# Patient Record
Sex: Male | Born: 1945 | Race: White | Hispanic: Yes | Marital: Married | Smoking: Current some day smoker
Health system: Southern US, Community
[De-identification: ages and names within clinical notes are randomized; demographics above are authoritative.]

## PROBLEM LIST (undated history)

## (undated) ENCOUNTER — Emergency Department (HOSPITAL_COMMUNITY): Admission: EM | Payer: Self-pay | Source: Home / Self Care

## (undated) DIAGNOSIS — Z950 Presence of cardiac pacemaker: Secondary | ICD-10-CM

## (undated) DIAGNOSIS — I2510 Atherosclerotic heart disease of native coronary artery without angina pectoris: Secondary | ICD-10-CM

## (undated) DIAGNOSIS — I10 Essential (primary) hypertension: Secondary | ICD-10-CM

## (undated) DIAGNOSIS — I495 Sick sinus syndrome: Secondary | ICD-10-CM

## (undated) HISTORY — DX: Presence of cardiac pacemaker: Z95.0

## (undated) HISTORY — PX: PACEMAKER INSERTION: SHX728

## (undated) HISTORY — DX: Sick sinus syndrome: I49.5

---

## 2007-06-09 ENCOUNTER — Ambulatory Visit: Payer: Self-pay | Admitting: Internal Medicine

## 2007-09-13 ENCOUNTER — Ambulatory Visit: Payer: Self-pay | Admitting: Internal Medicine

## 2008-02-24 ENCOUNTER — Ambulatory Visit: Payer: Self-pay

## 2008-12-21 ENCOUNTER — Encounter: Payer: Self-pay | Admitting: Internal Medicine

## 2008-12-26 ENCOUNTER — Ambulatory Visit: Payer: Self-pay | Admitting: Internal Medicine

## 2009-02-07 ENCOUNTER — Ambulatory Visit: Payer: Self-pay | Admitting: Internal Medicine

## 2009-05-09 ENCOUNTER — Ambulatory Visit: Payer: Self-pay | Admitting: Internal Medicine

## 2009-08-08 ENCOUNTER — Ambulatory Visit: Payer: Self-pay | Admitting: Internal Medicine

## 2009-08-10 DIAGNOSIS — I495 Sick sinus syndrome: Secondary | ICD-10-CM

## 2009-08-10 HISTORY — DX: Sick sinus syndrome: I49.5

## 2009-08-14 ENCOUNTER — Ambulatory Visit: Payer: Self-pay | Admitting: Internal Medicine

## 2009-08-14 DIAGNOSIS — R072 Precordial pain: Secondary | ICD-10-CM | POA: Insufficient documentation

## 2009-11-07 ENCOUNTER — Ambulatory Visit: Payer: Self-pay | Admitting: Internal Medicine

## 2010-01-03 ENCOUNTER — Telehealth: Payer: Self-pay | Admitting: Internal Medicine

## 2010-02-06 ENCOUNTER — Ambulatory Visit: Payer: Self-pay | Admitting: Internal Medicine

## 2010-02-12 ENCOUNTER — Ambulatory Visit: Payer: Self-pay | Admitting: Internal Medicine

## 2010-02-12 DIAGNOSIS — F341 Dysthymic disorder: Secondary | ICD-10-CM | POA: Insufficient documentation

## 2010-05-08 ENCOUNTER — Ambulatory Visit: Payer: Self-pay | Admitting: Internal Medicine

## 2010-06-04 ENCOUNTER — Ambulatory Visit: Payer: Self-pay | Admitting: Internal Medicine

## 2010-06-04 DIAGNOSIS — R42 Dizziness and giddiness: Secondary | ICD-10-CM | POA: Insufficient documentation

## 2010-06-04 DIAGNOSIS — R0989 Other specified symptoms and signs involving the circulatory and respiratory systems: Secondary | ICD-10-CM

## 2010-06-04 DIAGNOSIS — R0609 Other forms of dyspnea: Secondary | ICD-10-CM | POA: Insufficient documentation

## 2010-06-06 ENCOUNTER — Telehealth (INDEPENDENT_AMBULATORY_CARE_PROVIDER_SITE_OTHER): Payer: Self-pay | Admitting: *Deleted

## 2010-06-06 ENCOUNTER — Encounter: Payer: Self-pay | Admitting: Internal Medicine

## 2010-06-10 ENCOUNTER — Encounter (HOSPITAL_COMMUNITY): Admission: RE | Admit: 2010-06-10 | Discharge: 2010-07-11 | Payer: Self-pay | Admitting: Internal Medicine

## 2010-06-10 ENCOUNTER — Ambulatory Visit: Payer: Self-pay | Admitting: Internal Medicine

## 2010-06-10 ENCOUNTER — Ambulatory Visit: Payer: Self-pay

## 2010-07-01 ENCOUNTER — Ambulatory Visit: Payer: Self-pay

## 2010-07-01 ENCOUNTER — Encounter: Payer: Self-pay | Admitting: Internal Medicine

## 2010-07-29 ENCOUNTER — Encounter: Payer: Self-pay | Admitting: Internal Medicine

## 2010-07-29 ENCOUNTER — Ambulatory Visit: Payer: Self-pay

## 2010-08-07 ENCOUNTER — Ambulatory Visit: Payer: Self-pay | Admitting: Internal Medicine

## 2010-08-07 DIAGNOSIS — IMO0001 Reserved for inherently not codable concepts without codable children: Secondary | ICD-10-CM | POA: Insufficient documentation

## 2010-08-13 ENCOUNTER — Ambulatory Visit: Payer: Self-pay | Admitting: Internal Medicine

## 2010-08-13 LAB — CONVERTED CEMR LAB
Basophils Absolute: 0 10*3/uL (ref 0.0–0.1)
CO2: 25 meq/L (ref 19–32)
Calcium: 8.9 mg/dL (ref 8.4–10.5)
Creatinine, Ser: 0.8 mg/dL (ref 0.4–1.5)
Eosinophils Absolute: 0.3 10*3/uL (ref 0.0–0.7)
INR: 1 (ref 0.8–1.0)
Lymphocytes Relative: 29.6 % (ref 12.0–46.0)
MCHC: 34.1 g/dL (ref 30.0–36.0)
Neutrophils Relative %: 61.9 % (ref 43.0–77.0)
Prothrombin Time: 10.3 s (ref 9.7–11.8)
RBC: 4.24 M/uL (ref 4.22–5.81)
RDW: 14.6 % (ref 11.5–14.6)
TSH: 0.67 microintl units/mL (ref 0.35–5.50)

## 2010-08-19 ENCOUNTER — Ambulatory Visit: Payer: Self-pay | Admitting: Internal Medicine

## 2010-08-19 ENCOUNTER — Ambulatory Visit (HOSPITAL_COMMUNITY)
Admission: RE | Admit: 2010-08-19 | Discharge: 2010-08-19 | Payer: Self-pay | Source: Home / Self Care | Admitting: Internal Medicine

## 2010-08-21 ENCOUNTER — Telehealth: Payer: Self-pay | Admitting: Internal Medicine

## 2010-08-22 ENCOUNTER — Encounter: Payer: Self-pay | Admitting: Internal Medicine

## 2010-09-02 ENCOUNTER — Ambulatory Visit: Payer: Self-pay

## 2010-11-20 NOTE — Letter (Signed)
Summary: Implantable Device Instructions  Architectural technologist, Main Office  [address]   [place], [place] [address]   Phone: [phone]  Fax: [phone]      Implantable Device Instructions  You are scheduled for:  ___x__ Generator Change  on August 19, 2010 @ 9:00 am with Dr. Graciela Husbands.  1.  Please arrive at the Short Stay Center at [facility] at 7:00 am on the day of your procedure.  2.  Do not eat or drink after midnight  the night before your procedure.  3.  Complete lab work on August 13, 2010 at 3:00 pm.  The lab at [address] is open from 8:30 AM to 1:30 PM and from 2:30 PM to 5:00 PM.  The lab at [facility] is open from 7:30 AM to 5:30 PM.  You do not have to be fasting.  4.  Plan for an overnight stay.  Bring your insurance cards and a list of your medications.  6.  Wash your chest and neck with antibacterial soap (any brand) the evening before and the morning of your procedure.  Rinse well.   *If you have ANY questions after you get home, please call the office [phone]. Claris Gladden, RN  *Every attempt is made to prevent procedures from being rescheduled.  Due to the nauture of Electrophysiology, rescheduling can happen.  The physician is always aware and directs the staff when this occurs.

## 2010-11-20 NOTE — Cardiovascular Report (Signed)
Summary: TTM   TTM   Imported By: Roderic Ovens 02/15/2010 15:04:56  _____________________________________________________________________  External Attachment:    Type:   Image     Comment:   External Document

## 2010-11-20 NOTE — Cardiovascular Report (Signed)
Summary: Office Visit   Office Visit   Imported By: Roderic Ovens 07/16/2010 15:04:04  _____________________________________________________________________  External Attachment:    Type:   Image     Comment:   External Document

## 2010-11-20 NOTE — Cardiovascular Report (Signed)
Summary: Pre Op Orders   Pre Op Orders   Imported By: Roderic Ovens 08/16/2010 13:09:20  _____________________________________________________________________  External Attachment:    Type:   Image     Comment:   External Document

## 2010-11-20 NOTE — Procedures (Signed)
Summary: DEVICE/SAF  Medications Added TRAMADOL HCL 50 MG TABS (TRAMADOL HCL) Take 1 tablet two times a day as needed      Allergies Added: NKDA  Current Medications (verified): 1)  Quinapril Hcl 5 Mg Tabs (Quinapril Hcl) .... Take One Tablet Once Daily 2)  Atenolol 50 Mg Tabs (Atenolol) .... 1/2 By Mouth Daily 3)  Aspirin 325 Mg Tabs (Aspirin) .... Once Daily 4)  Valium 5 Mg Tabs (Diazepam) .... As Needed 5)  Tramadol Hcl 50 Mg Tabs (Tramadol Hcl) .... Take 1 Tablet Two Times A Day As Needed  Allergies (verified): No Known Drug Allergies  PPM Specifications Following MD:  Sherryl Manges, MD     PPM Vendor:  St Jude     PPM Model Number:  [id number]     PPM Serial Number:  [id number] PPM DOI:  03/25/1999     PPM Implanting MD:  Sherryl Manges, MD  Lead 1    Location: RA     DOI: 03/25/1999     Model #: 1388TC     Serial #: [id number]     Status: active Lead 2    Location: RV     DOI: 03/25/1999     Model #: [id number]     Serial #: [id number]     Status: active  Magnet Response Rate:  BOL 98.6 ERI  86.3  Indications:  Sick sinus syndrome   PPM Follow Up Battery Voltage:  2.65 V     Pacer Dependent:  Yes      Episodes Coumadin:  No  Parameters Mode:  DDDR     Lower Rate Limit:  60     Upper Rate Limit:  105 Paced AV Delay:  250     Sensed AV Delay:  200 Tech Comments:  INTERROGATION ONLY--BATTERY VOLTAGE 2.65--PER JENNY @ [facility]--ANYTIME W/100% PACING.  ROV 07-29-10 FOR BATTERY CHECK. Vella Kohler  July 01, 2010 1:54 PM

## 2010-11-20 NOTE — Assessment & Plan Note (Signed)
Summary: Nuc-Pre-Procedure       Nuclear Med Background Indications for Stress Test: Evaluation for Ischemia, Surgical Clearance  Indications Comments: Pending generator change in September by Dr. Berton Mount  History: GXT, Pacemaker  History Comments: '05 EAV:WUJWJX; '02 PTVP  Symptoms: Chest Pressure, Chest Pressure with Exertion, Diaphoresis, Dizziness, DOE, Palpitations, Rapid HR  Symptoms Comments: Last episode of BJ:[id number].   Nuclear Pre-Procedure Cardiac Risk Factors: Hypertension, Lipids Caffeine/Decaff Intake: none NPO After: 7:30 PM Lungs: Clear.  O2 Sat 97% on RA. IV 0.9% NS with Angio Cath: 22g     IV Site: L hand IV Started by: Darrick Penna Chest Size (in) 40     Height (in): 69 Weight (lb): 182 BMI: 26.97  Nuclear Med Study 1 or 2 day study:  1 day     Stress Test Type:  Eugenie Birks Reading MD:  Arvilla Meres, MD     Referring MD:  Berton Mount, MD Resting Radionuclide:  Technetium 73m Tetrofosmin     Resting Radionuclide Dose:  10.4 mCi  Stress Radionuclide:  Technetium 32m Tetrofosmin     Stress Radionuclide Dose:  33 mCi   Stress Protocol   Lexiscan: 0.4 mg   Stress Test Technologist:  Rea College CMA-N     Nuclear Technologist:  Domenic Polite CNMT  Rest Procedure  Myocardial perfusion imaging was performed at rest 45 minutes following the intravenous administration of Myoview Technetium 56m Tetrofosmin.  Stress Procedure  The patient received IV Lexiscan 0.4 mg over 15-seconds.  Myoview injected at 30-seconds.  There were no significant changes with infusion.  Quantitative spect images were obtained after a 45 minute delay.  QPS Raw Data Images:  Normal; no motion artifact; normal heart/lung ratio. Stress Images:  There is normal uptake in all areas. Rest Images:  Normal homogeneous uptake in all areas of the myocardium. Subtraction (SDS):  Normal Transient Ischemic Dilatation:  1.08  (Normal <1.22)  Lung/Heart Ratio:  .30   (Normal <0.45)  Quantitative Gated Spect Images QGS EDV:  118 ml QGS ESV:  59 ml QGS EF:  50 % QGS cine images:  Low normal EF. No focal wall motion abnormalities.  Findings Normal nuclear study      Overall Impression  Exercise Capacity: Lexiscan study with no exercise. ECG Impression: Baseline: NSR; No significant ST segment change with Lexiscan. Overall Impression: Normal stress nuclear study. Overall Impression Comments: Perfusion is normal. Low normal EF. No focal wall motion abnormalities.    Appended Document: Nuc-Pre-Procedure PT AWARE./cy

## 2010-11-20 NOTE — Progress Notes (Signed)
Summary: Nuc Pre-Procedure  Phone Note Outgoing Call Call back at [facility] Phone [phone]   Call placed by: Antionette Char RN,  June 06, 2010 1:45 PM Call placed to: Patient Reason for Call: Confirm/change Appt Summary of Call: Reviewed information on Myoview Information Sheet (see scanned document for further details).  Spoke with patient.     Nuclear Med Background Indications for Stress Test: Evaluation for Ischemia   History: GXT, Pacemaker  History Comments: 2005 Negative GXT 2002 Pacemaker for SSS Chronotropic Incompetence  Symptoms: Dizziness, DOE    Nuclear Pre-Procedure Cardiac Risk Factors: Hypertension, Lipids Height (in): 69

## 2010-11-20 NOTE — Assessment & Plan Note (Signed)
Summary: pc2/st jude/lg      Allergies Added: NKDA  History of Present Illness:    Lawrence Shaw is status post pacemaker implantation for sinus node  dysfunction.  He continues to complain of chronic left-sided chest pain. This is moderately worsened with exertion. However, he states back to when he underwent one of his extraction procedures with a left thoracotomy.  I have recommended Myoview scanning in the past which is refused. I also recommended pain clinic referral which he has declined.  Problems Prior to Update: 1)  Chest Pain, Precordial  (ICD-786.51) 2)  Sinoatrial Node Dysfunction  (ICD-427.81) 3)  Cardiac Pacemaker-st Jude Affinity Dr 5330  (ICD-V45.01)  Current Medications (verified): 1)  Quinapril Hcl 5 Mg Tabs (Quinapril Hcl) .... Take One Tablet Once Daily 2)  Atenolol 50 Mg Tabs (Atenolol) .... Take One Tablet Once Daily 3)  Aspirin 325 Mg Tabs (Aspirin) .... Once Daily 4)  Valium 5 Mg Tabs (Diazepam) .... As Needed  Allergies (verified): No Known Drug Allergies  Past History:  Past Medical History: Last updated: 08/10/2009 Sinus node dysfunction GE reflux disease Hypertension Dyslipidemia Eczema Anxiety Tinnitus Pacemaker-St. Jude Affinity DR 5330     Past Surgical History: Last updated: 08/10/2009 Pacemaker implantation  Pacemaker extraction and reimplantation-St Jude affinity DR 5330  Vital Signs:  Patient profile:   65 year old male Height:      69 inches (175.26 cm) Weight:      184 pounds (83.64 kg) BMI:     27.27 Pulse rate:   68 / minute BP sitting:   118 / 78  (left arm) Cuff size:   regular  Vitals Entered By: Judithe Modest CMA (February 12, 2010 11:43 AM)  Physical Exam  General:  The patient was alert and oriented in no acute distress. HEENT Normal.  Neck veins were flat, carotids were brisk.  Lungs were clear.  Heart sounds were regular without murmurs or gallops.  Abdomen was soft with active bowel sounds. There is no clubbing  cyanosis or edema. Skin Warm and dry    PPM Specifications Following MD:  Sherryl Manges, MD     PPM Vendor:  St Jude     PPM Model Number:  [id number]     PPM Serial Number:  [id number] PPM DOI:  03/25/1999     PPM Implanting MD:  Sherryl Manges, MD  Lead 1    Location: RA     DOI: 03/25/1999     Model #: 1388TC     Serial #: [id number]     Status: active Lead 2    Location: RV     DOI: 03/25/1999     Model #: 1388TC     Serial #: [id number]     Status: active  Magnet Response Rate:  BOL 98.6 ERI  86.3  Indications:  Sick sinus syndrome   PPM Follow Up Remote Check?  No Battery Voltage:  2.72 V     Pacer Dependent:  Yes       PPM Device Measurements Atrium  Amplitude: 4.0 mV, Impedance: 370 ohms, Threshold: 0.75 V at 0.4 msec Right Ventricle  Amplitude: 8.0 mV, Impedance: 410 ohms, Threshold: 1.5 V at 0.4 msec  Episodes MS Episodes:  0     Percent Mode Switch:  0     Coumadin:  No Atrial Pacing:  95%     Ventricular Pacing:  51%  Parameters Mode:  DDDR     Lower Rate Limit:  60  Upper Rate Limit:  105 Paced AV Delay:  250     Sensed AV Delay:  200 Next Cardiology Appt Due:  08/20/2010 Tech Comments:  RV reprogrammed 3.0@0 .4.  TTM's with [employer].  ROV 6 months clinic.  Checked by [employer].   Altha Harm, LPN  February 12, 2010 12:05 PM   Impression & Recommendations:  Problem # 1:  CARDIAC PACEMAKER-ST JUDE AFFINITY DR 5330 (ICD-V45.01) Device parameters and data were reviewed and no changes were made  Estimated longevity is 9 months at 100% pacing. We will plan to see him again in 4 months because there is a great deal of anxiety that the patient has related to battery longevity.  Problem # 2:  CHEST PAIN, PRECORDIAL (ICD-786.51) I again offered for his consideration chest pain referral. He is again declining. We also discussed the potential interaction of pain and depression as his affect is exceedingly flat and sad. Hehowever  would like not to pursue assistance in this regard His  updated medication list for this problem includes:    Quinapril Hcl 5 Mg Tabs (Quinapril hcl) .Marland Kitchen... Take one tablet once daily    Atenolol 50 Mg Tabs (Atenolol) .Marland Kitchen... Take one tablet once daily    Aspirin 325 Mg Tabs (Aspirin) ..... Once daily  Problem # 3:  SINOATRIAL NODE DYSFUNCTION (ICD-427.81) stable device function His updated medication list for this problem includes:    Quinapril Hcl 5 Mg Tabs (Quinapril hcl) .Marland Kitchen... Take one tablet once daily    Atenolol 50 Mg Tabs (Atenolol) .Marland Kitchen... Take one tablet once daily    Aspirin 325 Mg Tabs (Aspirin) ..... Once daily  Appended Document: [facility]      Allergies: No Known Drug Allergies   PPM Specifications Following MD:  Sherryl Manges, MD     PPM Vendor:  St Jude     PPM Model Number:  5330     PPM Serial Number:  [id number] PPM DOI:  03/25/1999     PPM Implanting MD:  Sherryl Manges, MD  Lead 1    Location: RA     DOI: 03/25/1999     Model #: 1388TC     Serial #: [id number]     Status: active Lead 2    Location: RV     DOI: 03/25/1999     Model #: 1388TC     Serial #: [id number]     Status: active  Magnet Response Rate:  BOL 98.6 ERI  86.3  Indications:  Sick sinus syndrome   PPM Follow Up Pacer Dependent:  Yes      Episodes Coumadin:  No  Parameters Mode:  DDDR     Lower Rate Limit:  60     Upper Rate Limit:  105 Paced AV Delay:  250     Sensed AV Delay:  200  Impression & Recommendations:  Problem # 1:  ANXIETY DEPRESSION (ICD-300.4) The patient anxiety is significant and it appears as if he has a secondary depression. We talked about a little bit. As noted previously he would like at this point not to pursue assistance in this regard

## 2010-11-20 NOTE — Cardiovascular Report (Signed)
Summary: TTM   TTM   Imported By: Roderic Ovens 05/17/2010 08:49:17  _____________________________________________________________________  External Attachment:    Type:   Image     Comment:   External Document

## 2010-11-20 NOTE — Miscellaneous (Signed)
Summary: Device change out  Clinical Lists Changes  Observations: Added new observation of PPM DOI: 08/19/2010 (08/22/2010 7:32) Added new observation of PPM SERL#: [id number]  (08/22/2010 7:32) Added new observation of PPM MODL#: [id number]  (08/22/2010 1:19) Added new observation of PPMEXPLCOMM: 08/19/10 St. Jude Affinity 5330/620000  explanted  (08/22/2010 7:32)      PPM Specifications Following MD:  Sherryl Manges, MD     PPM Vendor:  St Jude     PPM Model Number:  [id number]     PPM Serial Number:  [id number] PPM DOI:  08/19/2010     PPM Implanting MD:  Sherryl Manges, MD  Lead 1    Location: RA     DOI: 03/25/1999     Model #: 1388TC     Serial #: [id number]     Status: active Lead 2    Location: RV     DOI: 03/25/1999     Model #: 1388TC     Serial #: [id number]     Status: active  Magnet Response Rate:  BOL 98.6 ERI  86.3  Indications:  Sick sinus syndrome  Explantation Comments:  08/19/10 St. Jude Affinity 5330/620000  explanted  PPM Follow Up Pacer Dependent:  Yes      Episodes Coumadin:  No  Parameters Mode:  DDDR     Lower Rate Limit:  60     Upper Rate Limit:  105 Paced AV Delay:  250     Sensed AV Delay:  200

## 2010-11-20 NOTE — Cardiovascular Report (Signed)
Summary: Office Visit   Office Visit   Imported By: Roderic Ovens 06/05/2010 11:37:08  _____________________________________________________________________  External Attachment:    Type:   Image     Comment:   External Document

## 2010-11-20 NOTE — Progress Notes (Signed)
Summary: why he wasn't sent home with antibotics   Phone Note Call from Patient Call back at Home Phone [phone]   Caller: Daughter- Jovita Gamma [phone] Reason for Call: Talk to Nurse Summary of Call: per pt dtr, pt was wondering why he wasn't sent home with antiboitic.  Initial call taken by: Lorne Skeens,  August 21, 2010 11:45 AM  Follow-up for Phone Call        spoke w/pt daughter and discussed ABX and she expressed understanding.  Jovita Gamma stated that she has given her Father Tylenol and Ibuprofen and this does not relieve his pain. Pain is 8-9/10. Will see if can get something else prescribed.  Follow-up by: Claris Gladden RN,  August 21, 2010 2:01 PM  Additional Follow-up for Phone Call Additional follow up Details #1::        Spoke w/DOD and he adv pt needs to be seen in ED or urgent care to be checked for pneumothorax. Adv pt daughter and she is hesitant to take her Father because of the long wait. Stressed importance of having this checked because of unrelieved pain. She will discuss w/her Father and call back.  Pt denies SHOB.  Adv Pt daughter that I would let the ED know they were on their way when ready.  Additional Follow-up by: Claris Gladden RN,  August 21, 2010 2:24 PM    Additional Follow-up for Phone Call Additional follow up Details #2::    spoke w/Mr. Schoch and he states that his pain is a lot better today.  Follow-up by: Claris Gladden RN,  August 22, 2010 9:01 AM

## 2010-11-20 NOTE — Procedures (Signed)
Summary: DEVICE/SAF      Allergies Added: NKDA  Current Medications (verified): 1)  Quinapril Hcl 5 Mg Tabs (Quinapril Hcl) .... Take One Tablet Once Daily 2)  Atenolol 50 Mg Tabs (Atenolol) .... 1/2 By Mouth Daily 3)  Aspirin 325 Mg Tabs (Aspirin) .... Once Daily 4)  Valium 5 Mg Tabs (Diazepam) .... As Needed 5)  Tramadol Hcl 50 Mg Tabs (Tramadol Hcl) .... Take 1 Tablet Two Times A Day As Needed  Allergies (verified): No Known Drug Allergies  PPM Specifications Following MD:  Sherryl Manges, MD     PPM Vendor:  St Jude     PPM Model Number:  [phone]     PPM Serial Number:  [id number] PPM DOI:  03/25/1999     PPM Implanting MD:  Sherryl Manges, MD  Lead 1    Location: RA     DOI: 03/25/1999     Model #: 1388TC     Serial #: [id number]     Status: active Lead 2    Location: RV     DOI: 03/25/1999     Model #: 1388TC     Serial #: [id number]     Status: active  Magnet Response Rate:  BOL 98.6 ERI  86.3  Indications:  Sick sinus syndrome   PPM Follow Up Remote Check?  No Battery Voltage:  2.62 V     Battery Est. Longevity:  ERI     Pacer Dependent:  Yes      Episodes Coumadin:  No  Parameters Mode:  DDDR     Lower Rate Limit:  60     Upper Rate Limit:  105 Paced AV Delay:  250     Sensed AV Delay:  200 Tech Comments:  Interrogation only, device @ ERI. Altha Harm, LPN  July 29, 2010 12:02 PM

## 2010-11-20 NOTE — Progress Notes (Signed)
Summary: pt having chest pain   Phone Note Call from Patient   Caller: Daughter Reason for Call: Talk to Nurse, Talk to Doctor Summary of Call: pt having  chest pain right now wants to be seen sooner Initial call taken by: Omer Jack,  January 03, 2010 4:56 PM  Follow-up for Phone Call        SPOKE WITH PT'S DAUGHTER PT HAVING CP TODAY THAT HAS BEEN MOST ALL DAY, IT STARTED THIS AM, PT STATES THE ONLY THING THAT MAKES IT ANY BETTER IS FOR HIM TO BE REAL STILL AND STRAIGHTEN HIS BACK, DEEP BREATHS HURT, NO N/V PAIN IS UNDER L BREAST IN GOES INTO HIS BACK IT CAUSES HIM TO BE DIZZY AT TIMES.  HE  DENIES ANY RECENT INJURY OR HEAVY LIFTING.  HE HAS NO SL NTG TO TRY.  ADVISED DAUGHTER TO CALL 911 AND HAVE PT TAKEN TO ED AT [facility] FOR EVAL.  SHE STATED UNDERSTANDING Follow-up by: Charolotte Capuchin, RN,  January 03, 2010 5:19 PM

## 2010-11-20 NOTE — Cardiovascular Report (Signed)
Summary: TTM   TTM   Imported By: Roderic Ovens 12/07/2009 15:53:13  _____________________________________________________________________  External Attachment:    Type:   Image     Comment:   External Document

## 2010-11-20 NOTE — Cardiovascular Report (Signed)
Summary: Office Visit   Office Visit   Imported By: Roderic Ovens 02/19/2010 16:38:52  _____________________________________________________________________  External Attachment:    Type:   Image     Comment:   External Document

## 2010-11-20 NOTE — Cardiovascular Report (Signed)
Summary: Office Visit   Office Visit   Imported By: Roderic Ovens 08/19/2010 12:50:03  _____________________________________________________________________  External Attachment:    Type:   Image     Comment:   External Document

## 2010-11-20 NOTE — Assessment & Plan Note (Signed)
Summary: @ [facility] per paula/saf      Allergies Added: NKDA  Visit Type:  Follow-up  CC:  chest pain and headaches.  History of Present Illness:    Lawrence Shaw is status post pacemaker implantation for sinus node  dysfunction.  He ihas reached ERI.  When we saw him last a few weeks ago he noted worsening exercise intolerance accompanied by diaphoresis but without chest pain. Myoview scanning demonstrated near-normal left ventricular function with an EF of 50% and no evidence of ischemia.    Also been a concern as to whether he has chronotropic incompetence. He has been reluctant in the past has reprogrammed his device.  he also complains of aching in his shoulders and his arms digitally at night. Review of his medications don't indicate any obvious agents     Problems Prior to Update: 1)  Myalgia  (ICD-729.1) 2)  Dyspnea On Exertion  (ICD-786.09) 3)  Orthostatic Dizziness  (ICD-780.4) 4)  Anxiety Depression  (ICD-300.4) 5)  Chest Pain, Precordial  (ICD-786.51) 6)  Sinoatrial Node Dysfunction  (ICD-427.81) 7)  Cardiac Pacemaker-st Jude Affinity Dr 5330  (ICD-V45.01)  Current Medications (verified): 1)  Quinapril Hcl 5 Mg Tabs (Quinapril Hcl) .... Take One Tablet Once Daily 2)  Atenolol 50 Mg Tabs (Atenolol) .... 1/2 By Mouth Daily 3)  Aspirin 325 Mg Tabs (Aspirin) .... Once Daily 4)  Valium 5 Mg Tabs (Diazepam) .... As Needed 5)  Tramadol Hcl 50 Mg Tabs (Tramadol Hcl) .... Take 1 Tablet Two Times A Day As Needed  Allergies (verified): No Known Drug Allergies  Past History:  Past Medical History: Last updated: 08/10/2009 Sinus node dysfunction GE reflux disease Hypertension Dyslipidemia Eczema Anxiety Tinnitus Pacemaker-St. Jude Affinity DR 5330     Vital Signs:  Patient profile:   65 year old male Height:      69 inches Weight:      185.50 pounds BMI:     27.49 Pulse rate:   54 / minute BP sitting:   155 / 93  (left arm) Cuff size:   large  Vitals Entered  By: Caralee Ates CMA (August 07, 2010 3:23 PM)  Physical Exam  General:  The patient was alert and oriented in no acute distress. HEENT Normal.  Neck veins were flat, carotids were brisk.  Lungs were clear.  Heart sounds were regular without murmurs or gallops.  Abdomen was soft with active bowel sounds. There is no clubbing cyanosis or edema. Skin Warm but a little bit damp    PPM Specifications Following MD:  Lawrence Manges, MD     PPM Vendor:  St Jude     PPM Model Number:  [id number]     PPM Serial Number:  [id number] PPM DOI:  03/25/1999     PPM Implanting MD:  Lawrence Manges, MD  Lead 1    Location: RA     DOI: 03/25/1999     Model #: 1388TC     Serial #: [id number]     Status: active Lead 2    Location: RV     DOI: 03/25/1999     Model #: 1388TC     Serial #: [id number]     Status: active  Magnet Response Rate:  BOL 98.6 ERI  86.3  Indications:  Sick sinus syndrome   PPM Follow Up Pacer Dependent:  Yes      Episodes Coumadin:  No  Parameters Mode:  DDDR     Lower Rate Limit:  60  Upper Rate Limit:  105 Paced AV Delay:  250     Sensed AV Delay:  200  Impression & Recommendations:  Problem # 1:  SINOATRIAL NODE DYSFUNCTION (ICD-427.81)  the patient is nearly 100% atrially paced. He needs to undergo device generator replacement. I have advised him that the rate response however the new device may be different. We have also reviewed the potential benefits as well as potential risks including but not limited to infection and lead fracture he understands this. He had undergone device   extraction omplicated by fracture and need for referral for snaring in the past. His updated medication list for this problem includes:    Quinapril Hcl 5 Mg Tabs (Quinapril hcl) .Marland Kitchen... Take one tablet once daily    Atenolol 50 Mg Tabs (Atenolol) .Marland Kitchen... 1/2 by mouth daily    Aspirin 325 Mg Tabs (Aspirin) ..... Once daily  Orders: T-2 View CXR (71020TC)  Problem # 2:  CARDIAC PACEMAKER-ST JUDE AFFINITY DR  5330 (ICD-V45.01) Device parameters and data were reviewed and no changes were made  at [facility]; as above  Problem # 3:  MYALGIA (ICD-729.1) we'll check a sedimentation rate to exclude polymyalgia rheumatica;  he is also somewhat diaphoretic to touch. Check a thyroid stimulating hormone  Problem # 4:  ;    Patient Instructions: 1)  Your physician recommends that you return for lab work on October 25th.  2)  Your physician recommends that you continue on your current medications as directed. Please refer to the Current Medication list given to you today. 3)  A chest x-ray takes a picture of the organs and structures inside the chest, including the heart, lungs, and blood vessels. This test can show several things, including, whether the heart is enlarged; whether fluid is building up in the lungs; and whether pacemaker / defibrillator leads are still in place. To be done today.

## 2010-11-20 NOTE — Procedures (Signed)
Summary: wound check      Allergies Added: NKDA  Current Medications (verified): 1)  Quinapril Hcl 5 Mg Tabs (Quinapril Hcl) .... Take One Tablet Once Daily 2)  Atenolol 50 Mg Tabs (Atenolol) .... 1/2 By Mouth Daily 3)  Aspirin 325 Mg Tabs (Aspirin) .... Once Daily 4)  Valium 5 Mg Tabs (Diazepam) .... As Needed 5)  Tramadol Hcl 50 Mg Tabs (Tramadol Hcl) .... Take 1 Tablet Two Times A Day As Needed  Allergies (verified): No Known Drug Allergies  PPM Specifications Following MD:  Sherryl Manges, MD     PPM Vendor:  St Jude     PPM Model Number:  [id number]     PPM Serial Number:  [id number] DOI:  08/19/2010     PPM Implanting MD:  Sherryl Manges, MD  Lead 1    Location: RA     DOI: 03/25/1999     Model #: 1388TC     Serial #: [id number]     Status: active Lead 2    Location: RV     DOI: 03/25/1999     Model #: 1388TC     Serial #: [id number]     Status: active  Magnet Response Rate:  BOL 98.6 ERI  86.3  Indications:  Sick sinus syndrome  Explantation Comments:  08/19/10 St. Jude Affinity 5330/620000  explanted  PPM Follow Up Remote Check?  No Battery Voltage:  3.01 V     Battery Est. Longevity:  9.3 years     Pacer Dependent:  No       PPM Device Measurements Atrium  Amplitude: 4.1 mV, Impedance: 400 ohms, Threshold: 0.75 V at 0.4 msec Right Ventricle  Amplitude: 9.6 mV, Impedance: 380 ohms, Threshold: 1.125 V at 0.8 msec  Episodes MS Episodes:  0     Percent Mode Switch:  0     Coumadin:  No Atrial Pacing:  98%     Ventricular Pacing:  5.6%  Parameters Mode:  DDDR     Lower Rate Limit:  60     Upper Rate Limit:  105 Paced AV Delay:  250     Sensed AV Delay:  200 Next Cardiology Appt Due:  11/20/2010 Tech Comments:  Steri strips removed, no redness or edema.  Ventricular autocapture programmed on.   Slope changed from 10->12 for more rate response.  ROV 3 months with Dr. Graciela Husbands. Altha Harm, LPN  September 02, 2010 4:10 PM

## 2010-11-20 NOTE — Assessment & Plan Note (Signed)
Summary: DEVICE/SAF  Medications Added ATENOLOL 50 MG TABS (ATENOLOL) 1/2 by mouth daily      Allergies Added: NKDA  History of Present Illness:    Mr. Cammarata is status post pacemaker implantation for sinus node  dysfunction.  He is approaching ERI.  He notes recently that he has had problems with progressive exercise intolerance. This is accompanied by some numbness in his hands bilaterally. There is no chest discomfort.  there has also been significant accompanying diaphoresis. He wonders whether this is related to mowing in the summer. However, according to him and his daughter there seems to be progressive nature to this  He also notes that there has been some orthostatic intolerance particularly over the last couple of months. He down titrated his atenolol with significant improvement in his symptoms  Current Medications (verified): 1)  Quinapril Hcl 5 Mg Tabs (Quinapril Hcl) .... Take One Tablet Once Daily 2)  Atenolol 50 Mg Tabs (Atenolol) .... 1/2 By Mouth Daily 3)  Aspirin 325 Mg Tabs (Aspirin) .... Once Daily 4)  Valium 5 Mg Tabs (Diazepam) .... As Needed  Allergies (verified): No Known Drug Allergies  Past History:  Past Medical History: Last updated: 08/10/2009 Sinus node dysfunction GE reflux disease Hypertension Dyslipidemia Eczema Anxiety Tinnitus Pacemaker-St. Jude Affinity DR 5330     Vital Signs:  Patient profile:   65 year old male Height:      69 inches Weight:      185 pounds BMI:     27.42 Pulse rate:   67 / minute Resp:     16 per minute BP sitting:   137 / 91  (left arm)  Vitals Entered By: Marrion Coy, CNA (June 04, 2010 11:01 AM)  Physical Exam  General:  The patient was alert and oriented in no acute distress. HEENT Normal.  Neck veins were flat, carotids were brisk.  Lungs were clear.  Heart sounds were regular without murmurs or gallops.  Abdomen was soft with active bowel sounds. There is no clubbing cyanosis or edema. Skin  Warm and dry    PPM Specifications Following MD:  Sherryl Manges, MD     PPM Vendor:  St Jude     PPM Model Number:  [id number]     PPM Serial Number:  [id number] PPM DOI:  03/25/1999     PPM Implanting MD:  Sherryl Manges, MD  Lead 1    Location: RA     DOI: 03/25/1999     Model #: 1388TC     Serial #: [id number]     Status: active Lead 2    Location: RV     DOI: 03/25/1999     Model #: 1388TC     Serial #: [id number]     Status: active  Magnet Response Rate:  BOL 98.6 ERI  86.3  Indications:  Sick sinus syndrome   PPM Follow Up Battery Voltage:  2.59 V     Pacer Dependent:  Yes       PPM Device Measurements Atrium  Amplitude: 3.5 mV, Impedance: 418 ohms, Threshold: 1.00 V at 0.4 msec Right Ventricle  Amplitude: 7.0 mV, Impedance: 443 ohms, Threshold: 1.00 V at 0.4 msec  Episodes MS Episodes:  0     Coumadin:  No Ventricular High Rate:  0     Atrial Pacing:  90%     Ventricular Pacing:  43%  Parameters Mode:  DDDR     Lower Rate Limit:  60     Upper  Rate Limit:  105 Paced AV Delay:  250     Sensed AV Delay:  200 Next Cardiology Appt Due:  07/05/2010 Tech Comments:  PER CHRISTOPHER @ [facility] 1 MTH TO ERI W/100% PACING.  NORMAL DEVICE FUNCTION.  CHANGED RV AMPLITUDE TO 3.50 DUE TO RV THRESHOLD.  ROV IN 1 MTH. Vella Kohler  June 04, 2010 11:21 AM  Impression & Recommendations:  Problem # 1:  ORTHOSTATIC DIZZINESS (ICD-780.4) Tseems to be temporally related to the summer and improved with decrease in his atenolol.  Problem # 2:  DYSPNEA ON EXERTION (ICD-786.09) I'm not sure of the mechanism of this. It may be related to the summer. It is also notable in his chronotropic incompetent and that his heart rate excursion on his pacemaker is left shifted to slower heart rates. He has been reluctant in the past and is reluctant again today to have [place] reprogram his device An  alternative explanation might be that this is an anginal equivalent. We'll undertake a Myoview scan. This will also allow for  assessment of his left ventricular function His updated medication list for this problem includes:    Quinapril Hcl 5 Mg Tabs (Quinapril hcl) .Marland Kitchen... Take one tablet once daily    Atenolol 50 Mg Tabs (Atenolol) .Marland Kitchen... 1/2 by mouth daily    Aspirin 325 Mg Tabs (Aspirin) ..... Once daily  Problem # 3:  SINOATRIAL NODE DYSFUNCTION (ICD-427.81) as above  Problem # 4:  CARDIAC PACEMAKER-ST JUDE AFFINITY DR 5330 (ICD-V45.01) Device parameters and data were reviewed and no changes were made  He is approaching ERI we'll see him in 4-5 weeks  Other Orders: Nuclear Stress Test (Nuc Stress Test)  Patient Instructions: 1)  Your physician recommends that you schedule a follow-up appointment in: 1 MONTH WITH KRISTIN AND PAULA 2)  Your physician recommends that you continue on your current medications as directed. Please refer to the Current Medication list given to you today. 3)  Your physician has requested that you have an adenosine myoview.  For further information please visit [email or web address]  Please follow instruction sheet, as given.

## 2010-12-24 ENCOUNTER — Encounter (INDEPENDENT_AMBULATORY_CARE_PROVIDER_SITE_OTHER): Payer: Self-pay | Admitting: Internal Medicine

## 2010-12-24 ENCOUNTER — Encounter: Payer: Self-pay | Admitting: Internal Medicine

## 2010-12-24 DIAGNOSIS — I498 Other specified cardiac arrhythmias: Secondary | ICD-10-CM

## 2010-12-24 DIAGNOSIS — R072 Precordial pain: Secondary | ICD-10-CM

## 2010-12-24 DIAGNOSIS — Z950 Presence of cardiac pacemaker: Secondary | ICD-10-CM

## 2010-12-24 DIAGNOSIS — R42 Dizziness and giddiness: Secondary | ICD-10-CM

## 2010-12-31 NOTE — Assessment & Plan Note (Signed)
Summary: PC2/ST JUDE PER CHECK OUT FROM PACER ON 09/02/10.Marland KitchenMarland KitchenLG  Medications Added TRAMADOL HCL 50 MG TABS (TRAMADOL HCL) Take 1 tablet two times a day as needed ISOSORBIDE MONONITRATE CR 30 MG XR24H-TAB (ISOSORBIDE MONONITRATE) 1 once daily      Allergies Added: NKDA  Visit Type:  Follow-up   History of Present Illness:    Lawrence Shaw is status post pacemaker implantation for sinus node  dysfunction.   last fall he underwent Explantation of previous device, repair of the lead, revision of the pocket and insertion of a new device. He's been doing relatively well. He is aware of his heartbeat particularly when he gets into bed and when he gets up. Overall is feeling better.  He is having some episodes of chest discomfort. They are described as a squeezing. In August 2011 he underwent myoview scanning demonstrated near-normal left ventricular function with an EF of 50% and no evidence of ischemia.        Current Medications (verified): 1)  Quinapril Hcl 5 Mg Tabs (Quinapril Hcl) .... Take One Tablet Once Daily 2)  Atenolol 50 Mg Tabs (Atenolol) .... 1/2 By Mouth Daily 3)  Aspirin 325 Mg Tabs (Aspirin) .... Once Daily 4)  Valium 5 Mg Tabs (Diazepam) .... As Needed 5)  Tramadol Hcl 50 Mg Tabs (Tramadol Hcl) .... Take 1 Tablet Two Times A Day As Needed  Allergies (verified): No Known Drug Allergies  Past History:  Past Medical History: Last updated: 12/23/2010 Sinus node dysfunction GE reflux disease Hypertension Dyslipidemia Eczema Anxiety Tinnitus Pacemaker-St. Jude Affinity DR 5330-device change out 2011 St. Jude Accent RF [id number]  Vital Signs:  Patient profile:   65 year old male Height:      69 inches Weight:      191.50 pounds BMI:     28.38 Pulse rate:   64 / minute BP sitting:   100 / 70  (left arm)  Vitals Entered By: Scherrie Bateman, LPN (December 23, 1608 4:37 PM)  Physical Exam  General:  The patient was alert and oriented in no acute distress. HEENT  Normal.  Neck veins were flat, carotids were brisk.  Lungs were clear.  Heart sounds were regular without murmurs or gallops.  Abdomen was soft with active bowel sounds. There is no clubbing cyanosis or edema. Skin Warm and dry pocket is well-healed   PPM Specifications Following MD:  Sherryl Manges, MD     PPM Vendor:  St Jude     PPM Model Number:  [id number]     PPM Serial Number:  [id number] PPM DOI:  08/19/2010     PPM Implanting MD:  Sherryl Manges, MD  Lead 1    Location: RA     DOI: 03/25/1999     Model #: 1388TC     Serial #: [id number]     Status: active Lead 2    Location: RV     DOI: 03/25/1999     Model #: 1388TC     Serial #: [id number]     Status: active  Magnet Response Rate:  BOL 98.6 ERI  86.3  Indications:  Sick sinus syndrome  Explantation Comments:  08/19/10 St. Jude Affinity 5330/620000  explanted  PPM Follow Up Pacer Dependent:  No      Episodes Coumadin:  No  Parameters Mode:  DDDR     Lower Rate Limit:  60     Upper Rate Limit:  105 Paced AV Delay:  250     Sensed  AV Delay:  200  Impression & Recommendations:  Problem # 1:  CHEST PAIN, PRECORDIAL (ICD-786.51) continues with atypical chest pain. I've given her a prescription for isosorbide mononitrate as well as suggested OTC PPIs. He'll let us know how he does  The following medications were removed from the medication list:    Quinapril Hcl 5 Mg Tabs (Quinapril hcl) .Marland Kitchen... Take one tablet once daily His updated medication list for this problem includes:    Atenolol 50 Mg Tabs (Atenolol) .Marland Kitchen... 1/2 by mouth daily    Aspirin 325 Mg Tabs (Aspirin) ..... Once daily    Isosorbide Mononitrate Cr 30 Mg Xr24h-tab (Isosorbide mononitrate) .Marland Kitchen... 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 Hopefully the isosorbide will not aggravate this.  Problem # 3:  SINOATRIAL NODE DYSFUNCTION (ICD-427.81) stable with substantial atrial pacing presents approximately 95%.  Heart rate excursion seems somewhat blunted The following medications were removed from the medication list:    Quinapril Hcl 5 Mg Tabs (Quinapril hcl) .Marland Kitchen... Take one tablet once daily His updated medication list for this problem includes:    Atenolol 50 Mg Tabs (Atenolol) .Marland Kitchen... 1/2 by mouth daily    Aspirin 325 Mg Tabs (Aspirin) ..... Once daily    Isosorbide Mononitrate Cr 30 Mg Xr24h-tab (Isosorbide mononitrate) .Marland Kitchen... 1 once daily  Problem # 4:  CARDIAC PACEMAKER-ST JUDE ACCENT PM2210 (ICD-V45.01) Device parameters and data were reviewed and no changes were made  Patient Instructions: 1)  Your physician recommends that you schedule a follow-up appointment in: 3 MONTHS WITH DR Graciela Husbands 2)  Your physician has recommended you make the following change in your medication: START ISOSORBIDE MON 30 MG once daily  3)  STOP QUINAPRIL Prescriptions: ISOSORBIDE MONONITRATE CR 30 MG XR24H-TAB (ISOSORBIDE MONONITRATE) 1 once daily  #90 x 3   Entered by:   Scherrie Bateman, LPN   Authorized by:   Nathen May, MD, [facility]   Signed by:   Scherrie Bateman, LPN on 16/07/9603   Method used:   Electronically to        [facility] [address] #[id number]* (retail)       [address] [place], [place]  [address]       Ph: [phone]       Fax: [phone]   RxID:   [id number]

## 2011-01-07 NOTE — Cardiovascular Report (Signed)
Summary: Office Visit   Office Visit   Imported By: Roderic Ovens 12/30/2010 15:06:05  _____________________________________________________________________  External Attachment:    Type:   Image     Comment:   External Document

## 2011-03-04 NOTE — Letter (Signed)
June 09, 2007    [facility]  [address]  [place], [place] [address]   RE:  Lawrence Shaw, Lawrence Shaw  MRN:  [id number]  /  DOB:  Feb 29, 1946   Dear Doctors:   Thank you very much for asking [place] to see Mr. Laurens Athens in  consultation regarding his pacemaker.   The patient comes with only scant records, but as best as I can tell his  pacemaker was initially implanted in 1996 while in [place] because of  symptomatic bradycardia.  A couple of years later it turned out that he  had developed an infection and explantation was undertaken from a right  prepectoral pocket.  Apparently, he had residual signs of sepsis and  there was retained lead in his ventricle.  As best as I can tell, at the  time of his initial extraction procedure he also ended up with a  thoracotomy on the left and he raised the question about whether they  went inside of his heart to try and get the lead.   In any case, he ended up with symptoms of ongoing sepsis and so he was  seen I think at [facility] where a transjugular extraction procedure  successfully removed the rest of his pacemaker.   Some where in this process he had an epicardial pacemaker implanted  which was subsequently changed in 2002 to a left prepectoral pacemaker  which is currently active.  The patient has major complaints of exercise  intolerance and exercise-induced lightheadedness.   He also has chronic chest pain which he dates back to the initial  extraction procedure and his thoracotomy.  It has been worked up, at  least in part, by a treadmill at [facility] done in 2005 which was more  notable for chronotropic incompetence than anything else.  Recommendations for Myoview scanning has been refused because of  financial considerations.   CURRENT MEDICATIONS:  Accupril, atenolol and Protonix.   He has no known drug allergies.   PAST MEDICAL HISTORY:  In addition to the above, is notable for:  1. GE reflux  disease.  2. Hypertension.  3. Dyslipidemia.  4. Eczema.  5. Anxiety.  6. Tinnitus.   EXAMINATION:  He was a middle-aged Hispanic male appearing his stated  age of 37.  His blood pressure was 140/92, his pulse was 60, his weight  was 185.  HEENT EXAM:  Demonstrated no icterus or xanthoma.  The neck veins were  about 7 cm.  Carotids were brisk and full bilaterally without bruits.  BACK:  Without kyphosis or scoliosis.  LUNGS:  Clear.  HEART SOUNDS:  Regular without murmurs or gallops.  ABDOMEN:  Soft with active bowel sounds.  EXTREMITIES:  Without edema.   Interrogation of his St. Jude Affinity pulse generator demonstrates that  he has a cell impedance of 1.9 kilo ohms, battery voltage of 2.75.  He  was reluctant to have [place] fully interrogate his device.  His ventricular  outputs were programmed at 2.5 at 0.4, the atrial outputs of 2 volts at  0.4.  His atrial threshold was 0.7 at 0.4 and that was the end of what  we got beside impedances which was 408 in the A and 406 in the V.  There  were multiple mode switch episodes but they are all short and I suspect  these represent oversensing.  I should note that his atrial sensing is  in a unipolar tip configuration for reasons that are not  clear.   IMPRESSION:  1. Sinus node dysfunction - symptomatic.  2. Status post pacemaker for the above, most recently implanted in      2002, with a history of infected right-sided device and an      indwelling abdominal device.  3. Chronotropic incompetence.  4. Hypertension.  5. Anxiety.  6. Chronic chest pain temporarily related to his extraction surgery      which included a thoracotomy.   Mr. Spahr was clearly very anxious about having anybody mess with his  pacemaker.  He actually was quite critical of the staff as they tried to  interrogate his device.   However, we talked about the importance of having a trusting  relationship with his physician and we talked about the fact that his   pacer was in the DDD mode without rate response.  We then took an  experiment and activated rate response and had him walk up and down the  stairs.  You could tell that he was feeling markedly better with the  activation of rate response.  Hopefully this will validate our  involvement in his care.  At this point, we will plan to see him again  in 3 months' time, at which time we will begin remote followup of his  device to allow for close monitoring of his battery voltage.   Thank you for the consultation.    Sincerely,      Duke Salvia, MD, [facility]  Electronically Signed    SCK/MedQ  DD: 06/09/2007  DT: 06/10/2007  Job #: [id number]

## 2011-03-04 NOTE — Assessment & Plan Note (Signed)
[facility]                         ELECTROPHYSIOLOGY OFFICE NOTE   NAME:Lawrence, Shaw                        MRN:          [id number]  DATE:09/13/2007                            DOB:          09/24/1946    [name] comes in today.  He is much better since his device was  reprogrammed and his exercise tolerance is improved.   MEDICATIONS:  1. Accupril 5 mg.  2. Atenolol 50 mg.  3. Protonix 40 mg.   EXAMINATION:  His blood pressure is 100/62 with a pulse of 67.  LUNGS:  Clear.  Heart sounds were regular.  EXTREMITIES:  Without edema.   Interrogation of his pacemaker demonstrates that his AV delay and his  PVARP were such that it locked him out of heart rates over 115, so we  reprogrammed the device and then walked him on the stairs a couple of  times and he was much improved with a maximum sensor rate of 135 as  opposed to 115.   IMPRESSION:  1. Sinus node dysfunction.  2. Status post pacer for the above.  3. Hypertension.  4. Anxiety.   I have refilled his prescription for Accupril, I have reprogrammed his  device as noted, and we will see him again in 6 months' time.     Duke Salvia, MD, [facility]  Electronically Signed    SCK/MedQ  DD: 09/13/2007  DT: 09/14/2007  Job #: [id number]   cc:   [facility]

## 2011-07-10 ENCOUNTER — Encounter: Payer: Self-pay | Admitting: Internal Medicine

## 2011-07-10 ENCOUNTER — Ambulatory Visit (INDEPENDENT_AMBULATORY_CARE_PROVIDER_SITE_OTHER): Payer: Self-pay | Admitting: Internal Medicine

## 2011-07-10 DIAGNOSIS — Z950 Presence of cardiac pacemaker: Secondary | ICD-10-CM

## 2011-07-10 DIAGNOSIS — R002 Palpitations: Secondary | ICD-10-CM | POA: Insufficient documentation

## 2011-07-10 DIAGNOSIS — I495 Sick sinus syndrome: Secondary | ICD-10-CM

## 2011-07-10 HISTORY — DX: Presence of cardiac pacemaker: Z95.0

## 2011-07-10 LAB — PACEMAKER DEVICE OBSERVATION
AL AMPLITUDE: 4.2 mv
BAMS-0001: 160 {beats}/min
BATTERY VOLTAGE: 2.9629 V
RV LEAD THRESHOLD: 1.375 V

## 2011-07-10 NOTE — Assessment & Plan Note (Signed)
He is 100% atrially paced with reasonable heart rate excursion

## 2011-07-10 NOTE — Progress Notes (Signed)
  HPI  Lawrence Shaw is a 65 y.o. male Seen in followup for sinus node  dysfunction.   2011  fall he underwent Explantation of previous  device, repair of the lead, revision of the pocket and insertion of a new device.   He's been doing relatively well. He is aware of his heartbeat particularly when he gets into bed and when he gets up     He is having some episodes of chest discomfort. They are described as a squeezing. In August 2011 he underwent myoview  No past surgical history on file.  Current Outpatient Prescriptions  Medication Sig Dispense Refill  . aspirin 81 MG tablet Take 81 mg by mouth daily.        Marland Kitchen atenolol (TENORMIN) 50 MG tablet Take 25 mg by mouth daily.        . diazepam (VALIUM) 5 MG tablet Take 5 mg by mouth every 6 (six) hours as needed.        Marland Kitchen ibuprofen (ADVIL,MOTRIN) 800 MG tablet Take 800 mg by mouth every 8 (eight) hours as needed.        Marland Kitchen omeprazole (PRILOSEC) 20 MG capsule Take 20 mg by mouth daily.        . traMADol (ULTRAM) 50 MG tablet Take 50 mg by mouth every 6 (six) hours as needed.          No Known Allergies  Review of Systems negative except from HPI and PMH  Physical Exam Well developed and well nourished in no acute distress HENT normal E scleral and icterus clear Neck Supple JVP flat; carotids brisk and full Clear to ausculation Regular rate and rhythm, no murmurs gallops or rub Soft with active bowel sounds No clubbing cyanosis and edema Alert and oriented, grossly normal motor and sensory function Skin Warm and Dry  ECG  Assessment and  Plan

## 2011-07-10 NOTE — Assessment & Plan Note (Signed)
These appear to be associated with initial movement. Because of that I reprogrammed his right sensor from a threshold auto -0.5 to auto +0.5 and decreased a slope of 12-10. We then walking around the office and on return his heart rate was 88-90. I should note that having stood up and gone to the door and 10 feet his heart rate had gone to 110 prior to reprogramming we also increased his AV delay to 352% ventricular pacing at the higher heart rates. This is associated with elimination of his palpitations associated with changes in position

## 2011-07-10 NOTE — Assessment & Plan Note (Signed)
The patient's device was interrogated and the information was fully reviewed.  The device was reprogrammed as above. 

## 2011-08-16 ENCOUNTER — Emergency Department (INDEPENDENT_AMBULATORY_CARE_PROVIDER_SITE_OTHER): Payer: Self-pay

## 2011-08-16 ENCOUNTER — Encounter (HOSPITAL_BASED_OUTPATIENT_CLINIC_OR_DEPARTMENT_OTHER): Payer: Self-pay | Admitting: *Deleted

## 2011-08-16 ENCOUNTER — Emergency Department (HOSPITAL_BASED_OUTPATIENT_CLINIC_OR_DEPARTMENT_OTHER)
Admission: EM | Admit: 2011-08-16 | Discharge: 2011-08-17 | Disposition: A | Discharge status: Other Acute Inpatient Hospital | Payer: Self-pay | Attending: Emergency Medicine | Admitting: Emergency Medicine

## 2011-08-16 DIAGNOSIS — Z79899 Other long term (current) drug therapy: Secondary | ICD-10-CM | POA: Insufficient documentation

## 2011-08-16 DIAGNOSIS — I6529 Occlusion and stenosis of unspecified carotid artery: Secondary | ICD-10-CM

## 2011-08-16 DIAGNOSIS — I10 Essential (primary) hypertension: Secondary | ICD-10-CM | POA: Insufficient documentation

## 2011-08-16 DIAGNOSIS — R42 Dizziness and giddiness: Secondary | ICD-10-CM

## 2011-08-16 DIAGNOSIS — F17200 Nicotine dependence, unspecified, uncomplicated: Secondary | ICD-10-CM | POA: Insufficient documentation

## 2011-08-16 DIAGNOSIS — I2510 Atherosclerotic heart disease of native coronary artery without angina pectoris: Secondary | ICD-10-CM | POA: Insufficient documentation

## 2011-08-16 DIAGNOSIS — R110 Nausea: Secondary | ICD-10-CM | POA: Insufficient documentation

## 2011-08-16 HISTORY — DX: Atherosclerotic heart disease of native coronary artery without angina pectoris: I25.10

## 2011-08-16 HISTORY — DX: Essential (primary) hypertension: I10

## 2011-08-16 LAB — CBC
HCT: 44 % (ref 39.0–52.0)
Hemoglobin: 15.2 g/dL (ref 13.0–17.0)
MCH: 30.9 pg (ref 26.0–34.0)
MCHC: 34.5 g/dL (ref 30.0–36.0)
MCV: 89.4 fL (ref 78.0–100.0)
Platelets: 207 10*3/uL (ref 150–400)
RBC: 4.92 MIL/uL (ref 4.22–5.81)
RDW: 13.8 % (ref 11.5–15.5)
WBC: 8.6 10*3/uL (ref 4.0–10.5)

## 2011-08-16 LAB — BASIC METABOLIC PANEL
BUN: 12 mg/dL (ref 6–23)
CO2: 28 mEq/L (ref 19–32)
Calcium: 9.6 mg/dL (ref 8.4–10.5)
Chloride: 104 mEq/L (ref 96–112)
Creatinine, Ser: 0.7 mg/dL (ref 0.50–1.35)
GFR calc Af Amer: >90 mL/min (ref >90)
GFR calc non Af Amer: >90 mL/min (ref >90)
Glucose, Bld: 108 mg/dL — ABNORMAL HIGH (ref 70–99)
Potassium: 4 mEq/L (ref 3.5–5.1)
Sodium: 141 mEq/L (ref 135–145)

## 2011-08-16 LAB — URINALYSIS, ROUTINE W REFLEX MICROSCOPIC
Bilirubin Urine: NEGATIVE
Glucose, UA: NEGATIVE mg/dL
Hgb urine dipstick: NEGATIVE
Ketones, ur: NEGATIVE mg/dL
Leukocytes, UA: NEGATIVE
Nitrite: NEGATIVE
Protein, ur: NEGATIVE mg/dL
Specific Gravity, Urine: 1.007 (ref 1.005–1.030)
Urobilinogen, UA: 0.2 mg/dL (ref 0.0–1.0)
pH: 6.5 (ref 5.0–8.0)

## 2011-08-16 LAB — TROPONIN I: Troponin I: <0.3 ng/mL (ref <0.30)

## 2011-08-16 MED ORDER — IOHEXOL 350 MG/ML SOLN
100.0000 mL | Freq: Once | INTRAVENOUS | Status: AC | PRN
Start: 2011-08-16 — End: 2011-08-16
  Administered 2011-08-16: 100 mL via INTRAVENOUS

## 2011-08-16 MED ORDER — HYDROMORPHONE HCL 1 MG/ML IJ SOLN
0.5000 mg | Freq: Once | INTRAMUSCULAR | Status: AC
  Administered 2011-08-16: 0.5 mg via INTRAVENOUS
  Filled 2011-08-16: qty 1

## 2011-08-16 MED ORDER — SODIUM CHLORIDE 0.9 % IV SOLN
INTRAVENOUS | Status: DC
  Administered 2011-08-16: 22:00:00 via INTRAVENOUS

## 2011-08-16 MED ORDER — DIAZEPAM 5 MG/ML IJ SOLN
5.0000 mg | Freq: Once | INTRAMUSCULAR | Status: AC
  Administered 2011-08-16: 5 mg via INTRAVENOUS
  Filled 2011-08-16: qty 2

## 2011-08-16 NOTE — ED Notes (Signed)
Patient ambulates to restroom without assistance or difficulty

## 2011-08-16 NOTE — ED Provider Notes (Signed)
History  Scribed for Raeford Razor, MD, the patient was seen in [facility]. The chart was scribed by Gilman Schmidt. The patients care was started at 8:40 PM. CSN: [id number] Arrival date & time: 08/16/2011  7:06 PM   First MD Initiated Contact with Patient 08/16/11 2034      Chief Complaint  Patient presents with  . Dizziness  . Nausea    HPI Lawrence Shaw is a 65 y.o. male who presents to the Emergency Department complaining of intermittent dizziness x 2 weeks today. Reports waking up this morning and noted that everything was spinning. Symptoms have worsened through out day. Associated symptoms of vomiting and nausea. Additionally notes pain in both arms, chest pain, neck pain, and left ear pain. Denies any vomiting or headache. Per family, pt has loss a lot of weight in the past months. There are no other associated symptoms and no other alleviating or aggravating factors.    Past Medical History  Diagnosis Date  . Hypertension   . Coronary artery disease     Past Surgical History  Procedure Date  . Pacemaker insertion     History reviewed. No pertinent family history.  History  Substance Use Topics  . Smoking status: Current Some Day Smoker    Types: Cigarettes  . Smokeless tobacco: Not on file  . Alcohol Use: Yes     Occas.      Review of Systems  HENT: Positive for ear pain and neck pain.   Cardiovascular: Positive for chest pain.  Gastrointestinal: Positive for nausea and vomiting.  Musculoskeletal:       Arm pain   Neurological: Positive for dizziness. Negative for headaches.  All other systems reviewed and are negative.    Allergies  Review of patient's allergies indicates no known allergies.  Home Medications   Current Outpatient Rx  Name Route Sig Dispense Refill  . ASPIRIN 81 MG PO TABS Oral Take 81 mg by mouth daily.      . ATENOLOL 50 MG PO TABS Oral Take 25 mg by mouth daily.      Marland Kitchen DIAZEPAM 5 MG PO TABS Oral Take 5 mg by mouth every 6 (six)  hours as needed. For anxiety    . IBUPROFEN 800 MG PO TABS Oral Take 800 mg by mouth every 8 (eight) hours as needed.      Marland Kitchen OMEPRAZOLE 20 MG PO CPDR Oral Take 20 mg by mouth daily.      . TRAMADOL HCL 50 MG PO TABS Oral Take 50 mg by mouth every 6 (six) hours as needed.        BP 153/94  Pulse 60  Temp(Src) 98.3 F (36.8 C) (Oral)  Resp 19  Ht 5\' 8"  (1.727 m)  Wt 176 lb (79.833 kg)  BMI 26.76 kg/m2  SpO2 95%  Physical Exam  Nursing note and vitals reviewed. Constitutional: He is oriented to person, place, and time. He appears well-developed and well-nourished.  Non-toxic appearance. He does not have a sickly appearance.  HENT:  Head: Normocephalic and atraumatic.       Mild L neck tenderness near angle L mandible. No bruit or thrill. No overlying skin lesion. Cerumen r ext aud canal but no impaction. Visualized portion of TM grossly normal. L TM normal. Neck supple. No reproducibility of symptoms with pressure on tragus.  Eyes: Conjunctivae, EOM and lids are normal. Pupils are equal, round, and reactive to light. Right eye exhibits no discharge. Left eye exhibits no discharge.  No nystagmus  Neck: Trachea normal, normal range of motion and full passive range of motion without pain. Neck supple.       Mild tenderness to left neck near left mandible  No Crepitus No thrill  No bruit  Cardiovascular: Regular rhythm and normal heart sounds.   Pulmonary/Chest: Effort normal and breath sounds normal. No respiratory distress.  Abdominal: Soft. Normal appearance. He exhibits no distension. There is no tenderness. There is no rebound and no CVA tenderness.  Musculoskeletal: Normal range of motion.       Strength 5/5 bilaterally    Neurological: He is alert and oriented to person, place, and time. He has normal strength and normal reflexes. No cranial nerve deficit. He exhibits normal muscle tone.       No reproducibility of symptoms with dix hallpike maneuvers. Good finger-to-nose  bilaterally.  Skin: Skin is warm, dry and intact. No rash noted.  Psychiatric: His behavior is normal. Thought content normal.    ED Course  Procedures  DIAGNOSTIC STUDIES: Oxygen Saturation is 100% on room air, normal by my interpretation.     Date: 08/16/2011  Rate: 60  Rhythm: normal sinus rhythm  QRS Axis: normal  Intervals: normal  ST/T Wave abnormalities: nonspecific ST changes  Conduction Disutrbances:nonspecific intraventricular conduction delay  Narrative Interpretation:   Old EKG Reviewed: none available     COORDINATION OF CARE: 8:40pm:  - Patient evaluated by ED physician, CT Angio Head &Neck, DG Chest, EKG and labs ordered  Results for orders placed during the hospital encounter of 08/16/11  URINALYSIS, ROUTINE W REFLEX MICROSCOPIC      Component Value Range   Color, Urine YELLOW  YELLOW    Appearance CLEAR  CLEAR    Specific Gravity, Urine 1.007  1.005 - 1.030    pH 6.5  5.0 - 8.0    Glucose, UA NEGATIVE  NEGATIVE (mg/dL)   Hgb urine dipstick NEGATIVE  NEGATIVE    Bilirubin Urine NEGATIVE  NEGATIVE    Ketones, ur NEGATIVE  NEGATIVE (mg/dL)   Protein, ur NEGATIVE  NEGATIVE (mg/dL)   Urobilinogen, UA 0.2  0.0 - 1.0 (mg/dL)   Nitrite NEGATIVE  NEGATIVE    Leukocytes, UA NEGATIVE  NEGATIVE   BASIC METABOLIC PANEL      Component Value Range   Sodium 141  135 - 145 (mEq/L)   Potassium 4.0  3.5 - 5.1 (mEq/L)   Chloride 104  96 - 112 (mEq/L)   CO2 28  19 - 32 (mEq/L)   Glucose, Bld 108 (*) 70 - 99 (mg/dL)   BUN 12  6 - 23 (mg/dL)   Creatinine, Ser 6.04  0.50 - 1.35 (mg/dL)   Calcium 9.6  8.4 - 54.0 (mg/dL)   GFR calc non Af Amer >90  >90 (mL/min)   GFR calc Af Amer >90  >90 (mL/min)  TROPONIN I      Component Value Range   Troponin I <0.30  <0.30 (ng/mL)  CBC      Component Value Range   WBC 8.6  4.0 - 10.5 (K/uL)   RBC 4.92  4.22 - 5.81 (MIL/uL)   Hemoglobin 15.2  13.0 - 17.0 (g/dL)   HCT 98.1  19.1 - 47.8 (%)   MCV 89.4  78.0 - 100.0 (fL)   MCH  30.9  26.0 - 34.0 (pg)   MCHC 34.5  30.0 - 36.0 (g/dL)   RDW 29.5  62.1 - 30.8 (%)   Platelets 207  150 - 400 (K/uL)  MDM  64yM with vertigo. Pt somewhat poor historian with many vague complaints but does provide good description of true vertigo. No true positional component or reproducibility to suggest peripheral cause such as BPPV. No tinnitus/ear fullness to suggest meniere's. No reproducibility of symptoms with pressure on tragus as would be consistent with perilymphatic fistula.  No classsically ototoxic meds on med list. Per PMHx does have hx of orthostatic dizziness but symptoms not exacerbated by change of position. CTA neck done given L sided neck pain and mild tenderness to eval for possible vertebral/carotid artery dissection and negative. Concern for potential central cause such as vertbrobasilar insufficiency. Also consider dysrhythmia, infectious, tox, lytes, etc but would expect more dizziness/lightheadedness with these other potential etiologies as oppossed to vertigo. Neuro exam nonfocal and pt HD stable. Will admit for further eval.  I personally preformed the services scribed in my presence. The recorded information has been reviewed and considered. Raeford Razor, MD.        Raeford Razor, MD 08/17/11 [id number]

## 2011-08-16 NOTE — ED Notes (Signed)
Pt reports intermittent dizziness x 2 weeks, today woke up extremely dizzy and has vomited- feels weak

## 2011-08-17 ENCOUNTER — Inpatient Hospital Stay (HOSPITAL_COMMUNITY)
Admission: RE | Admit: 2011-08-17 | Discharge: 2011-08-17 | DRG: 149 | Disposition: A | Discharge status: Home / Self Care | Payer: Self-pay | Source: Other Acute Inpatient Hospital | Attending: Internal Medicine | Admitting: Internal Medicine

## 2011-08-17 DIAGNOSIS — K219 Gastro-esophageal reflux disease without esophagitis: Secondary | ICD-10-CM | POA: Diagnosis present

## 2011-08-17 DIAGNOSIS — Z79899 Other long term (current) drug therapy: Secondary | ICD-10-CM

## 2011-08-17 DIAGNOSIS — I2510 Atherosclerotic heart disease of native coronary artery without angina pectoris: Secondary | ICD-10-CM | POA: Diagnosis present

## 2011-08-17 DIAGNOSIS — E785 Hyperlipidemia, unspecified: Secondary | ICD-10-CM | POA: Diagnosis present

## 2011-08-17 DIAGNOSIS — Z950 Presence of cardiac pacemaker: Secondary | ICD-10-CM

## 2011-08-17 DIAGNOSIS — Z7982 Long term (current) use of aspirin: Secondary | ICD-10-CM

## 2011-08-17 DIAGNOSIS — F411 Generalized anxiety disorder: Secondary | ICD-10-CM | POA: Diagnosis present

## 2011-08-17 DIAGNOSIS — I10 Essential (primary) hypertension: Secondary | ICD-10-CM | POA: Diagnosis present

## 2011-08-17 DIAGNOSIS — R42 Dizziness and giddiness: Principal | ICD-10-CM | POA: Diagnosis present

## 2011-08-17 DIAGNOSIS — F17200 Nicotine dependence, unspecified, uncomplicated: Secondary | ICD-10-CM | POA: Diagnosis present

## 2011-08-17 DIAGNOSIS — I495 Sick sinus syndrome: Secondary | ICD-10-CM | POA: Diagnosis present

## 2011-08-17 NOTE — ED Notes (Signed)
All LDA's (lines, drains, airways, and wounds) were removed from documentation for discharge purposes.  At time of discharge all LDA's remained intact as were previously documented.    

## 2011-08-19 NOTE — Discharge Summary (Signed)
NAME:  Lawrence Shaw, Lawrence Shaw NO.:  [id number]  MEDICAL RECORD NO.:  [id number]  LOCATION:  5028                         FACILITY:  [facility]  PHYSICIAN:  Marinda Elk, M.D.DATE OF BIRTH:  Oct 02, 1946  DATE OF ADMISSION:  08/17/2011 DATE OF DISCHARGE:                              DISCHARGE SUMMARY   PRIMARY CARE DOCTOR:  Dr. Prentiss Bells at [facility], but he is going to see Dr. Fulton Mole.  DISCHARGE DIAGNOSES: 1. Dizziness, most likely secondary to vertigo. 2. Hypertension.  DISCHARGE MEDICATION: 1. Tylenol 650 mg q.4 hours p.r.n. 2. Aspirin 81 mg daily. 3. Lisinopril 5 mg daily. 4. Meclizine 12.5 mg b.i.d.  PROCEDURES PERFORMED:  Chest x-ray that showed no acute cardiopulmonary disease, stable examination.  CONSULTANTS:  None.  BRIEF ADMITTING HISTORY AND PHYSICAL:  This is a 65 year old male with history of sinus node dysfunction, status post pacemaker with a history of tinnitus and dizziness and vertigo was transferred from [facility] for evaluation.  The patient stated that 2 weeks ago, he started having dizziness that has been slowly progressively getting worse, a bit worse to the point where every time he moves his head or stands up, he get a sensation of the room spinning around him lasting around 20-30 minutes.  He did not sound presyncopal in nature when he explained it to the admitter.  He was also getting sensation when he rolled in bed.  He would not get the sensation when he was at rest or moving.  The sensation will last a couple of minutes; however, have progressively gotten worse to the point where yesterday he could not even stand up and he began vomiting in bed.  Please refer to dictation from August 17, 2011 for further details.  Labs on admission shows cardiac enzymes negative x1.  His sodium was 141, potassium 4.0, chloride 104, bicarb 28, glucose 108, BUN of 12, creatinine 0.7, calcium 9.6.  His white count  is 8.6, hemoglobin of 15.2, platelet count 207.  His UA is negative.  BRIEF HOSPITAL COURSE: 1. Dizziness, most likely secondary to vertigo.  He had nonfocal     physical exam.  He was able to ambulate after getting 1 dose of     meclizine.  He relates this has been happening for 2 weeks and has     happened in the past.  He has nonfocal physical exam.  The patient     wishes to go home and continue further evaluation as an outpatient.     I am leaning away from a stroke as the patient is nonfocal, he is     able to ambulate and his dizziness has just resolved.  I am leaning     away also from cardiac as he has had no chest pain, no shortness of     breath, no difficulty with walking or exertion and this has been     going on for 2 weeks and has happened in the past, so I will     continue him on meclizine and he was discharged in stable condition     and will follow up with his primary care doctor as  an outpatient. 2. Hypertension.  No changes were made.  Vitals on the day of discharge showed temperature 98, pulse 60, respirations 18, blood pressure 138/86.  He was satting 98% on room air.  Labs on the day of discharge were none.  DISPOSITION:  The patient will follow up with his primary care doctor here, who will see how his dizziness is doing and will titrate his blood pressure medication for his hypertension as needed.     Marinda Elk, M.D.     AF/MEDQ  D:  08/17/2011  T:  08/17/2011  Job:  [id number]  Electronically Signed by Marinda Elk M.D. on 08/19/2011 11:51:14 AM
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
## 2012-03-24 ENCOUNTER — Telehealth: Payer: Self-pay | Admitting: Internal Medicine

## 2012-03-24 ENCOUNTER — Encounter: Payer: Self-pay | Admitting: Internal Medicine

## 2012-03-24 NOTE — Telephone Encounter (Signed)
03-24-12 CALLED @ 438 PM AND LM FOR PT TO CALL BACK, ALSO SENT PAST DUE LETTER FOR PACER CK WITH DEVICE OR BROOKE/MT

## 2012-07-22 ENCOUNTER — Encounter: Payer: Self-pay | Admitting: Internal Medicine

## 2012-08-19 ENCOUNTER — Encounter: Payer: Self-pay | Admitting: Internal Medicine

## 2012-08-19 ENCOUNTER — Ambulatory Visit (INDEPENDENT_AMBULATORY_CARE_PROVIDER_SITE_OTHER): Payer: Self-pay | Admitting: Internal Medicine

## 2012-08-19 VITALS — BP 114/65 | HR 60 | Wt 182.0 lb

## 2012-08-19 DIAGNOSIS — R072 Precordial pain: Secondary | ICD-10-CM

## 2012-08-19 DIAGNOSIS — R002 Palpitations: Secondary | ICD-10-CM

## 2012-08-19 DIAGNOSIS — F341 Dysthymic disorder: Secondary | ICD-10-CM

## 2012-08-19 DIAGNOSIS — Z950 Presence of cardiac pacemaker: Secondary | ICD-10-CM

## 2012-08-19 DIAGNOSIS — I495 Sick sinus syndrome: Secondary | ICD-10-CM

## 2012-08-19 LAB — PACEMAKER DEVICE OBSERVATION
AL AMPLITUDE: 5 mv
AL IMPEDENCE PM: 380 Ohm
BAMS-0001: 160 {beats}/min
BATTERY VOLTAGE: 2.98 V
RV LEAD AMPLITUDE: 11.2 mv
RV LEAD IMPEDENCE PM: 350 Ohm
VENTRICULAR PACING PM: 5

## 2012-08-19 NOTE — Progress Notes (Signed)
  Patient has no care team.   HPI  Lawrence Shaw is a 65 y.o. male Seen in followup for sinus node dysfunction. 2011 fall he underwent Explantation of previous device, repair of the lead, revision of the pocket and insertion of a new device.   He's been doing relatively well. He is aware of his heartbeat particularly when he gets into bed and when he gets up  He is having some episodes of chest discomfort . They are described as a squeezing. In August 2011 he underwent myoview.  Cath 2011 >> non obstructive  He also complains of some thumping in his chest particularly at night   Past Medical History  Diagnosis Date  . Hypertension   . Coronary artery disease     Past Surgical History  Procedure Date  . Pacemaker insertion     Current Outpatient Prescriptions  Medication Sig Dispense Refill  . aspirin 325 MG tablet Take 325 mg by mouth daily.      Marland Kitchen atenolol (TENORMIN) 50 MG tablet Take 50 mg by mouth daily.       . diazepam (VALIUM) 5 MG tablet Take 5 mg by mouth every 6 (six) hours as needed. For anxiety      . diclofenac (VOLTAREN) 75 MG EC tablet Take 75 mg by mouth 2 (two) times daily.      Marland Kitchen oxycodone (OXY-IR) 5 MG capsule Take 5 mg by mouth every 4 (four) hours as needed.      . quinapril (ACCUPRIL) 20 MG tablet Take 20 mg by mouth daily.        No Known Allergies  Review of Systems negative except from HPI and PMH  Physical Exam BP 114/65  Pulse 60  Wt 182 lb (82.555 kg) Well developed and well nourished in no acute distress HENT normal E scleral and icterus clear Neck Supple JVP flat; carotids brisk and full Clear to ausculation Regular rate and rhythm, no murmurs gallops or rub Soft with active bowel sounds No clubbing cyanosis none Edema Alert and oriented, grossly normal motor and sensory function Skin Warm and Dry  Electrocardiogram demonstrates atrial pacing at 60 intervals 21/12/43 Nonspecific T wave changes Otherwise normal  Assessment and   Plan

## 2012-08-19 NOTE — Assessment & Plan Note (Signed)
.  sfy The patient's device was interrogated and the information was fully reviewed.  The device was reprogrammed As above

## 2012-08-19 NOTE — Assessment & Plan Note (Signed)
Stable non cardiac

## 2012-08-19 NOTE — Assessment & Plan Note (Signed)
Stable post pacing with 95% atrial pacing

## 2012-08-19 NOTE — Assessment & Plan Note (Signed)
Palpitations seem to correlate with ventricular pacing. One event that was recorded demonstrates that he has a variation of PMT wherein ventricular pacing was initiated. He then had retrograde conduction with a short VA interval, less than 200 ms, he is falling event atrial pacing as which continues to cycle. It occurs at a rate of only about 610 ms and so the intrinsic PMT termination algorithm  Is not effective  To do with this reprogrammed the device DDIR.

## 2012-08-19 NOTE — Patient Instructions (Signed)
Your physician wants you to follow-up in: 1 year.   You will receive a reminder letter in the mail two months in advance. If you don't receive a letter, please call our office to schedule the follow-up appointment.  Remote monitoring is used to monitor your Pacemaker of ICD from home. This monitoring reduces the number of office visits required to check your device to one time per year. It allows [place] to keep an eye on the functioning of your device to ensure it is working properly. You are scheduled for a device check from home on Nov 18, 2012. You may send your transmission at any time that day. If you have a wireless device, the transmission will be sent automatically. After your physician reviews your transmission, you will receive a postcard with your next transmission date.  Call us next week to let us know how you are feeling.

## 2012-08-30 ENCOUNTER — Telehealth: Payer: Self-pay | Admitting: Internal Medicine

## 2012-08-30 NOTE — Telephone Encounter (Signed)
Pt having headaches, dizziness and weakness for about a week now, pls advise [phone]

## 2012-08-31 ENCOUNTER — Ambulatory Visit (INDEPENDENT_AMBULATORY_CARE_PROVIDER_SITE_OTHER): Payer: Self-pay | Admitting: *Deleted

## 2012-08-31 ENCOUNTER — Encounter: Payer: Self-pay | Admitting: Internal Medicine

## 2012-08-31 DIAGNOSIS — I495 Sick sinus syndrome: Secondary | ICD-10-CM

## 2012-08-31 LAB — PACEMAKER DEVICE OBSERVATION
BAMS-0001: 160 {beats}/min
BATTERY VOLTAGE: 2.98 V
DEVICE MODEL PM: 7164872
VENTRICULAR PACING PM: 92

## 2012-08-31 NOTE — Telephone Encounter (Signed)
Since pt's device was re-programmed on 08-19-2012 pt is experiencing weakness and dizziness.  Will forward to [employer] for review.

## 2012-08-31 NOTE — Progress Notes (Signed)
See previous telephone note in regards to symptoms. Changed PAV delay from 200 to 300 to decrease amount of VP. Pt was seen 08-19-12 and change were made in which pt was very symptomatic. With PAV delay changes from 200 to 300 VS with AV delay around 227.

## 2012-08-31 NOTE — Telephone Encounter (Signed)
Pt seen by Nehemiah Settle with Dr Graciela Husbands on 08-19-12 and changed mode to DDIR due to PMT algorithm not working. Symptoms of head pressure/dizziness/weakness started with changes made to ppm. Will discuss with Brooke and return call to pt.

## 2012-08-31 NOTE — Telephone Encounter (Signed)
On 08-19-12 mode was changed to DDIR and AV delay decreased to 200. Pt scheduled for device check today at 1600. Pt aware of appt/kwm

## 2012-11-22 ENCOUNTER — Encounter: Payer: Self-pay | Admitting: *Deleted

## 2012-11-24 ENCOUNTER — Encounter: Payer: Self-pay | Admitting: *Deleted

## 2012-12-22 ENCOUNTER — Encounter: Payer: Self-pay | Admitting: *Deleted

## 2013-01-13 ENCOUNTER — Encounter: Payer: Self-pay | Admitting: Internal Medicine

## 2013-01-13 ENCOUNTER — Telehealth: Payer: Self-pay | Admitting: Internal Medicine

## 2013-01-13 NOTE — Telephone Encounter (Signed)
I spoke with the patient. Lawrence Shaw in the device clinic had already spoken with him and advised to send a transmission. He is having trouble with this. I have advised him to call the 800 # on the box to walk him through transmitting.

## 2013-01-13 NOTE — Telephone Encounter (Signed)
New problem   Pt is not feeling well his hearbeat is 48 and not 60.

## 2013-01-14 ENCOUNTER — Encounter: Payer: Self-pay | Admitting: *Deleted

## 2013-01-14 ENCOUNTER — Ambulatory Visit (INDEPENDENT_AMBULATORY_CARE_PROVIDER_SITE_OTHER): Payer: Self-pay | Admitting: Internal Medicine

## 2013-01-14 ENCOUNTER — Encounter: Payer: Self-pay | Admitting: Internal Medicine

## 2013-01-14 ENCOUNTER — Ambulatory Visit (INDEPENDENT_AMBULATORY_CARE_PROVIDER_SITE_OTHER): Payer: Self-pay | Admitting: *Deleted

## 2013-01-14 DIAGNOSIS — T82190A Other mechanical complication of cardiac electrode, initial encounter: Secondary | ICD-10-CM

## 2013-01-14 DIAGNOSIS — Z950 Presence of cardiac pacemaker: Secondary | ICD-10-CM

## 2013-01-14 DIAGNOSIS — Z01812 Encounter for preprocedural laboratory examination: Secondary | ICD-10-CM

## 2013-01-14 DIAGNOSIS — I495 Sick sinus syndrome: Secondary | ICD-10-CM

## 2013-01-14 LAB — PACEMAKER DEVICE OBSERVATION
AL IMPEDENCE PM: 1887.5 Ohm
AL THRESHOLD: 3.5 V
BAMS-0001: 160 {beats}/min
BATTERY VOLTAGE: 2.9629 V
RV LEAD IMPEDENCE PM: 1600 Ohm
RV LEAD THRESHOLD: 7.25 V

## 2013-01-14 LAB — BASIC METABOLIC PANEL
BUN: 21 mg/dL (ref 6–23)
CO2: 29 mEq/L (ref 19–32)
Chloride: 104 mEq/L (ref 96–112)
Creatinine, Ser: 1 mg/dL (ref 0.4–1.5)
Potassium: 5 mEq/L (ref 3.5–5.1)

## 2013-01-14 LAB — CBC WITH DIFFERENTIAL/PLATELET
Basophils Relative: 0.6 % (ref 0.0–3.0)
Eosinophils Absolute: 0.2 10*3/uL (ref 0.0–0.7)
Eosinophils Relative: 1.7 % (ref 0.0–5.0)
HCT: 49.6 % (ref 39.0–52.0)
Lymphs Abs: 2.6 10*3/uL (ref 0.7–4.0)
MCHC: 33 g/dL (ref 30.0–36.0)
MCV: 92.1 fl (ref 78.0–100.0)
Monocytes Absolute: 0.6 10*3/uL (ref 0.1–1.0)
RBC: 5.38 Mil/uL (ref 4.22–5.81)
WBC: 9.6 10*3/uL (ref 4.5–10.5)

## 2013-01-14 NOTE — Telephone Encounter (Signed)
Patient to be seen for device check in office 01/14/13.

## 2013-01-14 NOTE — Assessment & Plan Note (Signed)
The patient's device was interrogated.  The information was reviewed. No changes were made in the programming.    

## 2013-01-14 NOTE — Progress Notes (Signed)
Patient has no care team.   HPI  Lawrence Shaw is a 67 y.o. male Seen in followup for sinus node dysfunction. He is here today because of an alert that came from his remote transmission suggesting problems with his atrial and ventricular leads. The fact that interrogation both leads are failing.  He is a complex pacemaker history. He had an abdominal pacemaker with leads. 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 He has a left subclavian venous pacemaker implanted 2002 with generator replacement 2011. This used 1388 leads  He has noted some weakness  Past Medical History  Diagnosis Date  . Hypertension   . Coronary artery disease     Past Surgical History  Procedure Laterality Date  . Pacemaker insertion      Current Outpatient Prescriptions  Medication Sig Dispense Refill  . aspirin 325 MG tablet Take 325 mg by mouth daily.      Marland Kitchen atenolol (TENORMIN) 50 MG tablet Take 50 mg by mouth daily.       . diazepam (VALIUM) 5 MG tablet Take 5 mg by mouth every 6 (six) hours as needed. For anxiety      . diclofenac (VOLTAREN) 75 MG EC tablet Take 75 mg by mouth 2 (two) times daily.      Marland Kitchen oxycodone (OXY-IR) 5 MG capsule Take 5 mg by mouth every 4 (four) hours as needed.      . quinapril (ACCUPRIL) 20 MG tablet Take 20 mg by mouth daily.       No current facility-administered medications for this visit.    No Known Allergies  Review of Systems negative except from HPI and PMH  Physical Exam There were no vitals taken for this visit. Well developed and well nourished in no acute distress HENT normal E scleral and icterus clear Neck Supple JVP flat; carotids brisk and full Clear to ausculation  Device pocket well healed; without hematoma or erythema Regular rate and rhythm, no murmurs gallops or rub Soft with active bowel sounds No clubbing cyanosis none Edema Alert and oriented, grossly normal motor and sensory function Skin Warm and  Dry tearful    Assessment and  Plan

## 2013-01-14 NOTE — Assessment & Plan Note (Signed)
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 Ventricular impedance has been becoming unstable over the last 3 months or so. There has been over sensing inhibition and a variety of symptoms as a consequence.  His pacemaker history is complicated as noted above. We'll anticipate undertaking venography of the left subclavian venous system. If it is patent we will insert a new pacemaker system via this access. In the event that the venous system is occluded, we will refer him to Dr. Ladona Ridgel for extraction and reimplantation.  He is quite anxious. He is tearful.

## 2013-01-14 NOTE — Assessment & Plan Note (Signed)
Atrially paced and about 85% of the time. Device in the short-term have reprogrammed to the AAIR mode. He ventricularly paced only about 4%. There is however so much noise on the ventricular lead that there is no way that he can not disrupt timing in left reprogramm in the DOO mode.

## 2013-01-14 NOTE — Progress Notes (Signed)
This encounter was created in error - please disregard.

## 2013-01-16 MED ORDER — CEFAZOLIN SODIUM-DEXTROSE 2-3 GM-% IV SOLR
2.0000 g | INTRAVENOUS | Status: DC
  Filled 2013-01-16: qty 50

## 2013-01-16 MED ORDER — CEFAZOLIN SODIUM-DEXTROSE 2-3 GM-% IV SOLR
2.0000 g | INTRAVENOUS | Status: DC
  Filled 2013-01-16 (×2): qty 50

## 2013-01-16 MED ORDER — SODIUM CHLORIDE 0.9 % IR SOLN
80.0000 mg | Status: DC
  Filled 2013-01-16: qty 2

## 2013-01-16 MED ORDER — GENTAMICIN SULFATE 40 MG/ML IJ SOLN
80.0000 mg | INTRAMUSCULAR | Status: DC
  Filled 2013-01-16: qty 2

## 2013-01-17 ENCOUNTER — Ambulatory Visit (HOSPITAL_COMMUNITY)
Admission: RE | Admit: 2013-01-17 | Discharge: 2013-01-18 | Disposition: A | Discharge status: Home / Self Care | Payer: MEDICAID | Source: Ambulatory Visit | Attending: Internal Medicine | Admitting: Internal Medicine

## 2013-01-17 ENCOUNTER — Encounter (HOSPITAL_COMMUNITY): Payer: Self-pay | Admitting: *Deleted

## 2013-01-17 ENCOUNTER — Encounter (HOSPITAL_COMMUNITY): Payer: Self-pay

## 2013-01-17 ENCOUNTER — Encounter (HOSPITAL_COMMUNITY)
Admission: RE | Disposition: A | Discharge status: Home / Self Care | Payer: Self-pay | Source: Ambulatory Visit | Attending: Internal Medicine

## 2013-01-17 ENCOUNTER — Ambulatory Visit (HOSPITAL_COMMUNITY): Payer: Self-pay

## 2013-01-17 DIAGNOSIS — IMO0001 Reserved for inherently not codable concepts without codable children: Secondary | ICD-10-CM

## 2013-01-17 DIAGNOSIS — Y831 Surgical operation with implant of artificial internal device as the cause of abnormal reaction of the patient, or of later complication, without mention of misadventure at the time of the procedure: Secondary | ICD-10-CM | POA: Insufficient documentation

## 2013-01-17 DIAGNOSIS — I495 Sick sinus syndrome: Secondary | ICD-10-CM

## 2013-01-17 DIAGNOSIS — R002 Palpitations: Secondary | ICD-10-CM

## 2013-01-17 DIAGNOSIS — R42 Dizziness and giddiness: Secondary | ICD-10-CM

## 2013-01-17 DIAGNOSIS — T82190A Other mechanical complication of cardiac electrode, initial encounter: Secondary | ICD-10-CM | POA: Insufficient documentation

## 2013-01-17 DIAGNOSIS — Z950 Presence of cardiac pacemaker: Secondary | ICD-10-CM

## 2013-01-17 DIAGNOSIS — F341 Dysthymic disorder: Secondary | ICD-10-CM

## 2013-01-17 DIAGNOSIS — R0989 Other specified symptoms and signs involving the circulatory and respiratory systems: Secondary | ICD-10-CM

## 2013-01-17 DIAGNOSIS — R072 Precordial pain: Secondary | ICD-10-CM

## 2013-01-17 DIAGNOSIS — I10 Essential (primary) hypertension: Secondary | ICD-10-CM | POA: Insufficient documentation

## 2013-01-17 DIAGNOSIS — R0609 Other forms of dyspnea: Secondary | ICD-10-CM

## 2013-01-17 DIAGNOSIS — I2510 Atherosclerotic heart disease of native coronary artery without angina pectoris: Secondary | ICD-10-CM | POA: Insufficient documentation

## 2013-01-17 HISTORY — PX: LEAD REVISION: SHX5945

## 2013-01-17 HISTORY — PX: VENOGRAM: SHX5497

## 2013-01-17 LAB — SURGICAL PCR SCREEN: Staphylococcus aureus: NEGATIVE

## 2013-01-17 SURGERY — LEAD REVISION
Anesthesia: LOCAL

## 2013-01-17 MED ORDER — LISINOPRIL 5 MG PO TABS
5.0000 mg | ORAL_TABLET | Freq: Every day | ORAL | Status: DC
  Administered 2013-01-17 – 2013-01-18 (×2): 5 mg via ORAL
  Filled 2013-01-17 (×2): qty 1

## 2013-01-17 MED ORDER — MIDAZOLAM HCL 5 MG/5ML IJ SOLN
INTRAMUSCULAR | Status: AC
  Filled 2013-01-17: qty 5

## 2013-01-17 MED ORDER — CHLORHEXIDINE GLUCONATE 4 % EX LIQD: 60.0000 mL | Freq: Once | CUTANEOUS | Status: DC

## 2013-01-17 MED ORDER — OXYCODONE-ACETAMINOPHEN 5-325 MG PO TABS
1.0000 | ORAL_TABLET | ORAL | Status: DC | PRN
  Administered 2013-01-17 – 2013-01-18 (×4): 1 via ORAL
  Filled 2013-01-17 (×4): qty 1

## 2013-01-17 MED ORDER — SODIUM CHLORIDE 0.9 % IV SOLN
INTRAVENOUS | Status: AC
  Administered 2013-01-17: 18:00:00 via INTRAVENOUS

## 2013-01-17 MED ORDER — FENTANYL CITRATE 0.05 MG/ML IJ SOLN
INTRAMUSCULAR | Status: AC
  Filled 2013-01-17: qty 2

## 2013-01-17 MED ORDER — ATENOLOL 50 MG PO TABS
50.0000 mg | ORAL_TABLET | Freq: Every day | ORAL | Status: DC
  Administered 2013-01-17 – 2013-01-18 (×2): 50 mg via ORAL
  Filled 2013-01-17 (×2): qty 1

## 2013-01-17 MED ORDER — CEFAZOLIN SODIUM 1-5 GM-% IV SOLN
1.0000 g | Freq: Four times a day (QID) | INTRAVENOUS | Status: AC
  Administered 2013-01-17 – 2013-01-18 (×3): 1 g via INTRAVENOUS
  Filled 2013-01-17 (×3): qty 50

## 2013-01-17 MED ORDER — ACETAMINOPHEN 325 MG PO TABS: 325.0000 mg | ORAL_TABLET | ORAL | Status: DC | PRN

## 2013-01-17 MED ORDER — HEPARIN (PORCINE) IN NACL 2-0.9 UNIT/ML-% IJ SOLN
INTRAMUSCULAR | Status: AC
  Filled 2013-01-17: qty 500

## 2013-01-17 MED ORDER — DIAZEPAM 5 MG PO TABS
5.0000 mg | ORAL_TABLET | Freq: Four times a day (QID) | ORAL | Status: DC | PRN
  Administered 2013-01-17: 22:00:00 5 mg via ORAL
  Filled 2013-01-17: qty 1

## 2013-01-17 MED ORDER — MUPIROCIN 2 % EX OINT
TOPICAL_OINTMENT | Freq: Two times a day (BID) | CUTANEOUS | Status: DC
  Administered 2013-01-17: 1 via NASAL
  Filled 2013-01-17: qty 22

## 2013-01-17 MED ORDER — LIDOCAINE HCL (PF) 1 % IJ SOLN
INTRAMUSCULAR | Status: AC
  Filled 2013-01-17: qty 60

## 2013-01-17 MED ORDER — IBUPROFEN 200 MG PO TABS
200.0000 mg | ORAL_TABLET | Freq: Four times a day (QID) | ORAL | Status: DC | PRN
  Filled 2013-01-17 (×2): qty 2

## 2013-01-17 MED ORDER — SODIUM CHLORIDE 0.9 % IV SOLN
INTRAVENOUS | Status: DC
  Administered 2013-01-17: 50 mL/h via INTRAVENOUS

## 2013-01-17 MED ORDER — PNEUMOCOCCAL VAC POLYVALENT 25 MCG/0.5ML IJ INJ
0.5000 mL | INJECTION | INTRAMUSCULAR | Status: DC
  Filled 2013-01-17: qty 0.5

## 2013-01-17 MED ORDER — ASPIRIN 325 MG PO TABS
325.0000 mg | ORAL_TABLET | Freq: Every day | ORAL | Status: DC
  Administered 2013-01-18: 325 mg via ORAL
  Filled 2013-01-17: qty 1

## 2013-01-17 MED ORDER — ONDANSETRON HCL 4 MG/2ML IJ SOLN: 4.0000 mg | Freq: Four times a day (QID) | INTRAMUSCULAR | Status: DC | PRN

## 2013-01-17 MED ORDER — YOU HAVE A PACEMAKER BOOK
Freq: Once | Status: AC
  Administered 2013-01-17: 21:00:00
  Filled 2013-01-17: qty 1

## 2013-01-17 NOTE — Interval H&P Note (Signed)
History and Physical Interval Note:  01/17/2013 2:56 PM  Lawrence Shaw  has presented today for surgery, with the diagnosis of lead malfunction  The various methods of treatment have been discussed with the patient and family. After consideration of risks, benefits and other options for treatment, the patient has consented to  Procedure(s): LEAD REVISION (N/A) VENOGRAM (N/A) as a surgical intervention .  The patient's history has been reviewed, patient examined, no change in status, stable for surgery.  I have reviewed the patient's chart and labs.  Questions were answered to the patient's satisfaction.     Sherryl Manges

## 2013-01-17 NOTE — CV Procedure (Signed)
Lawrence Shaw [id number]  [id number]  Preop Dx: atrial and ventricular lead failure;  Postop Dx same/patent left subclavian vein; possible generator failure  Procedure: venogram, peripheral and central X2 Generator removal, generator insertion Lead insertion X2 Pocket revision  Cx: None   Dictation number [id number]  Sherryl Manges, MD 01/17/2013 5:05 PM

## 2013-01-17 NOTE — H&P (View-Only) (Signed)
Patient has no care team.   HPI  Lawrence Shaw is a 67 y.o. male Seen in followup for sinus node dysfunction. He is here today because of an alert that came from his remote transmission suggesting problems with his atrial and ventricular leads. The fact that interrogation both leads are failing.  He is a complex pacemaker history. He had an abdominal pacemaker with leads. 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 He has a left subclavian venous pacemaker implanted 2002 with generator replacement 2011. This used 1388 leads  He has noted some weakness  Past Medical History  Diagnosis Date  . Hypertension   . Coronary artery disease     Past Surgical History  Procedure Laterality Date  . Pacemaker insertion      Current Outpatient Prescriptions  Medication Sig Dispense Refill  . aspirin 325 MG tablet Take 325 mg by mouth daily.      . atenolol (TENORMIN) 50 MG tablet Take 50 mg by mouth daily.       . diazepam (VALIUM) 5 MG tablet Take 5 mg by mouth every 6 (six) hours as needed. For anxiety      . diclofenac (VOLTAREN) 75 MG EC tablet Take 75 mg by mouth 2 (two) times daily.      . oxycodone (OXY-IR) 5 MG capsule Take 5 mg by mouth every 4 (four) hours as needed.      . quinapril (ACCUPRIL) 20 MG tablet Take 20 mg by mouth daily.       No current facility-administered medications for this visit.    No Known Allergies  Review of Systems negative except from HPI and PMH  Physical Exam There were no vitals taken for this visit. Well developed and well nourished in no acute distress HENT normal E scleral and icterus clear Neck Supple JVP flat; carotids brisk and full Clear to ausculation  Device pocket well healed; without hematoma or erythema Regular rate and rhythm, no murmurs gallops or rub Soft with active bowel sounds No clubbing cyanosis none Edema Alert and oriented, grossly normal motor and sensory function Skin Warm and  Dry tearful    Assessment and  Plan  

## 2013-01-18 ENCOUNTER — Ambulatory Visit (HOSPITAL_COMMUNITY): Payer: Self-pay

## 2013-01-18 DIAGNOSIS — Z950 Presence of cardiac pacemaker: Secondary | ICD-10-CM

## 2013-01-18 MED ORDER — MUPIROCIN 2 % EX OINT: TOPICAL_OINTMENT | CUTANEOUS | Status: DC

## 2013-01-18 NOTE — Progress Notes (Signed)
     Patient: Dorsey Authement Date of Encounter: 01/18/2013, 7:08 AM Admit date: 01/17/2013     Subjective  Mr. Heyward reports incisional soreness. He denies CP or SOB.   Objective  Physical Exam: Vitals: BP 118/72  Pulse 60  Temp(Src) 97.9 F (36.6 C) (Oral)  Resp 20  Ht 5\' 9"  (1.753 m)  Wt 185 lb 10 oz (84.2 kg)  BMI 27.4 kg/m2  SpO2 98% General: Well developed, well appearing 67 year old male in no acute distress. Neck: Supple. JVD not elevated. Lungs: Clear bilaterally to auscultation without wheezes, rales, or rhonchi. Breathing is unlabored. Heart: RRR S1 S2 without murmurs, rubs, or gallops.  Abdomen: Soft, non-distended. Extremities: No clubbing or cyanosis. No edema.  Distal pedal pulses are 2+ and equal bilaterally. Neuro: Alert and oriented X 3. Moves all extremities spontaneously. No focal deficits. Skin: Left upper chest/implant site intact without significant bleeding or hematoma.  Intake/Output:  Intake/Output Summary (Last 24 hours) at 01/18/13 0708 Last data filed at 01/18/13 0600  Gross per 24 hour  Intake 316.67 ml  Output    550 ml  Net -233.33 ml    Inpatient Medications:  . aspirin  325 mg Oral Daily  . atenolol  50 mg Oral Daily  .  ceFAZolin (ANCEF) IV  1 g Intravenous Q6H  . lisinopril  5 mg Oral Daily  . mupirocin ointment   Nasal BID  . pneumococcal 23 valent vaccine  0.5 mL Intramuscular Tomorrow-1000    Labs: None in last 48 hours  Chest x-ray: this AM pending Device interrogation: this AM pending Telemetry: A paced V sensed    Assessment and Plan  1. Atrial and ventricular lead failure, question device failure s/p PPM gen change and lead revision 2. Sinus node dysfunction s/p PPM  Signed, Kennethia Lynes PA-C

## 2013-01-18 NOTE — Progress Notes (Signed)
Some discomfort   F/u per protocol Pt at high risk for obstructive of venous system with two new leads and stenosis Will hold off on anticoagulation but he is instructed to call for any swelling of hand/ arm at which time anticoagulation would be appropriate

## 2013-01-18 NOTE — Progress Notes (Signed)
Utilization Review Completed Iliany Losier J. Rosia Syme, RN, BSN, NCM [phone]  

## 2013-01-18 NOTE — Discharge Summary (Signed)
ELECTROPHYSIOLOGY DISCHARGE SUMMARY    Patient ID: Lawrence Shaw,  MRN: [id number], DOB/AGE: 1946/04/28 67 y.o.  Admit date: 01/17/2013 Discharge date: 01/18/2013  Primary Care Physician: None Primary Cardiologist: Berton Mount, MD  Primary Discharge Diagnosis:  1. Atrial and ventricular lead failure, question device failure s/p PPM gen change and lead revision  2. Sinus node dysfunction s/p PPM 3. MRSA with positive swab on admission  Secondary Discharge Diagnoses:  1. HTN  Procedures This Admission:  1. Venogram, peripheral and central x 2, generator removal, generator insertion, atrial and ventricular lead insertion, pocket revision 01/17/2013 RA lead - information not dictated in the procedure/op note RV lead - St. Jude Tendril [phone] active fixation  ventricular lead, serial number [id number] PPM - Accent DR [id number] pulse generator, serial number [id number]  History and Hospital Course:  Lawrence Shaw is a 67 year old man with sinus node dysfunction and previous PPM implant who was seen in the office last week after being found to have an alert from his remote transmission suggesting problems with his atrial and ventricular leads. He was found to have high impedances and pacing thresholds with both leads by interrogation in the office. He presented yesterday for PPM generator change and lead revision. Please see operative report for full details. Lawrence Shaw tolerated this procedure well without any immediate complication. He remains hemodynamically stable and afebrile. His chest xray shows stable lead placement without pneumothorax. His device interrogation shows normal PPM function with stable lead parameters/measurements. His implant site is intact without significant bleeding or hematoma. He has been given discharge instructions including wound care and activity restrictions. He will follow-up in 10 days for wound check. Of note, his nasal swab for MRSA was positive so muciprocin ointment was  added to his medication regimen; otherwise, there were no changes made to his medications. He has been seen, examined and deemed stable for discharge today by Dr. Berton Mount.  Discharge Vitals: Blood pressure 118/72, pulse 60, temperature 97.9 F (36.6 C), temperature source Oral, resp. rate 20, height 5\' 9"  (1.753 m), weight 185 lb 10 oz (84.2 kg), SpO2 98.00%.   Labs: Lab Results  Component Value Date   WBC 9.6 01/14/2013   HGB 16.4 01/14/2013   HCT 49.6 01/14/2013   MCV 92.1 01/14/2013   PLT 255.0 01/14/2013    Recent Labs Lab 01/14/13 1054  NA 140  K 5.0  CL 104  CO2 29  BUN 21  CREATININE 1.0  CALCIUM 9.6  GLUCOSE 101*    Disposition:  The patient is being discharged in stable condition.  Follow-up: Follow-up Information   Follow up with [facility] Device 1 On 01/27/2013. (At 3:30 PM for wound check)    Contact information:   [address] [place] [address] [phone]      Follow up with Sherryl Manges, MD In 3 months. (For pacemaker follow-up; Our office will mail a reminder letter)    Contact information:   [address] [place] [address] [phone]     Discharge Medications:    Medication List    TAKE these medications       aspirin 325 MG tablet  Take 325 mg by mouth daily.     atenolol 50 MG tablet  Commonly known as:  TENORMIN  Take 50 mg by mouth daily.     diazepam 5 MG tablet  Commonly known as:  VALIUM  Take 5 mg by mouth every 6 (six) hours as  needed. For anxiety     ibuprofen 200 MG tablet  Commonly known as:  ADVIL,MOTRIN  Take 200-400 mg by mouth every 6 (six) hours as needed for pain.     mupirocin ointment 2 %  Commonly known as:  BACTROBAN  Apply to nares twice daily for 7 days.     oxyCODONE-acetaminophen 5-325 MG per tablet  Commonly known as:  PERCOCET/ROXICET  Take 1 tablet by mouth every 4 (four) hours as needed for pain.     quinapril 5 MG tablet  Commonly known as:  ACCUPRIL   Take 5 mg by mouth daily.       Duration of Discharge Encounter: Greater than 30 minutes including physician time.  Signed, Rick Duff, PA-C 01/18/2013, 10:37 AM

## 2013-01-18 NOTE — Op Note (Signed)
Lawrence Shaw, Lawrence Shaw NO.:  [id number]  MEDICAL RECORD NO.:  [id number]  LOCATION:  6526                         FACILITY:  [facility]  PHYSICIAN:  Duke Salvia, MD, FACCDATE OF BIRTH:  07-Apr-1946  DATE OF PROCEDURE:  01/17/2013 DATE OF DISCHARGE:                              OPERATIVE REPORT   PREOPERATIVE DIAGNOSES:  Sinus node dysfunction, previously implanted pacemaker, atrial and ventricular lead failure.  POSTOPERATIVE DIAGNOSES:  Sinus node dysfunction, previously implanted pacemaker, atrial and ventricular lead failure; innominate vein stenosis; question device failure.  DESCRIPTION OF PROCEDURE:  Following obtaining informed consent, the patient was brought to the Electrophysiology Laboratory and placed on the fluoroscopic table in supine position.  Contrast venography demonstrated the patency of the extrathoracic left subclavian vein and identified a track at the junction of the innominate and superior vena cava.  Lidocaine was infiltrated cephalad to the previous incision halfway between it and a level of the clavicle.  An incision was made and carried down to layer of the device pocket using electrocautery and sharp dissection.  The ventricular lead was inspected and found to be heme discolored.  However, interrogation of the ventricular lead fail to demonstrate any of the abnormality seen through the device.  These had been checked just prior to the procedure and there was a significant discordance here.  At this juncture, it was elected to proceed with replacing the leads and also consideration of replacing the pulse generator depending on what was found with the atrial lead.  Access was obtained in the subclavian vein without complication.  However, it became exceedingly difficult to pass wire past the junction of the innominate and the superior vena cava.  We ended up using initially a Wholey wire.  I was unable to get it to pass.  We took  another venogram and there appeared to be a cephalad track as opposed to the caudal track along the right side of the innominate vein, which we were then able to cannulate and passed into the inferior vena cava.  I was unable, however, to pass a 7-French dilator over this wire.  We then took a long 6-French dilator and placed it into the proximal superior vena cava into the junction of the right atrium.  We then took an Amplatz superstiff wire and placed it into the right ventricle.  Using this, we then passed a long 7-French sheath into the proximal superior vena cava and this allowed [place] to deploy a St. Jude Tendril [phone] active fixation ventricular lead, serial number [id number].  It was manipulated the right ventricular apex where the bipolar R-wave was 10.8 with a pacing impedance of 735, a threshold of 0.8 at 0.4, current at threshold was 1.0 mA.  There was no diaphragmatic pacing at 10 volts and the current of injury was brisk.  This lead was secured to the prepectoral fascia. We then freed up the atrial lead.  Interrogation of the atrial lead demonstrated an impedance of 3500 confirming failure of the right atrial lead.  We then utilized the 6-French dilator and a long 7-French sheath to deploy an atrial lead, which was placed in the mouth of the right atrial appendage  where the bipolar P-wave was 3.2 with a pacing impedance of 532 with threshold of 1.1 volts at 0.4 milliseconds. Current of threshold was 2.0 mA.  There was no diaphragmatic pacing at 10 volts and the current of injury was brisk.  This lead was also secured to the prepectoral fascia and the leads were then attached to an Accent DR PM2110 pulse generator, serial number [id number].  Note that this is the new generator.  The other generator was abandoned because of the concerns about why the ventricular lead failure was identified when the ventricular lead interrogation was normal although the appearance of the lead was  not.  The pocket was copiously irrigated with antibiotic containing saline solution and antimicrobial defibrillator pouch was applied (AEGIS).  The leads and pulse generator were then placed in the pocket, secured to the prepectoral fascia.  The pocket was copiously irrigated with antibiotic containing saline solution.  Surgicel was placed at the cephalad aspect of the pocket and the wound was then closed in 2 layers in normal fashion.  The wound was washed, dried, and a benzoin Steri-Strip dressing was applied.  Needle counts, sponge counts, and instrument counts were correct at the end of the procedure according to the staff. The patient tolerated the procedure without apparent complication.     Duke Salvia, MD, [facility]     SCK/MEDQ  D:  01/17/2013  T:  01/18/2013  Job:  [id number]

## 2013-01-19 ENCOUNTER — Telehealth: Payer: Self-pay | Admitting: Internal Medicine

## 2013-01-19 NOTE — Telephone Encounter (Signed)
**  TCM** Patient is concerned about having 4 pacer wires in his heart; concerned that he will have a stroke.  Patient assured that it would be greater risk to try to remove the initial 2 wires and that patient should continue to take ASA to keep blood thin. Patient states Dr. Graciela Husbands informed him of s/s of stroke and to call 911 if he experiences any of those symptoms.  Patient states that he taking all medications as directed.  Patient also c/o difficulty working due to health problems and that medical bills are adding up.  Patient aware of appointment on 4/10.

## 2013-01-27 ENCOUNTER — Other Ambulatory Visit: Payer: Self-pay

## 2013-01-27 ENCOUNTER — Ambulatory Visit (INDEPENDENT_AMBULATORY_CARE_PROVIDER_SITE_OTHER): Payer: Self-pay | Admitting: *Deleted

## 2013-01-27 DIAGNOSIS — F341 Dysthymic disorder: Secondary | ICD-10-CM

## 2013-01-27 LAB — PACEMAKER DEVICE OBSERVATION
AL AMPLITUDE: 5 mv
RV LEAD AMPLITUDE: 12 mv
RV LEAD IMPEDENCE PM: 525 Ohm
RV LEAD THRESHOLD: 0.625 V

## 2013-01-27 NOTE — Progress Notes (Signed)
Wound check defib in clinic  

## 2013-02-11 ENCOUNTER — Ambulatory Visit (INDEPENDENT_AMBULATORY_CARE_PROVIDER_SITE_OTHER): Payer: Self-pay | Admitting: *Deleted

## 2013-02-11 DIAGNOSIS — I495 Sick sinus syndrome: Secondary | ICD-10-CM

## 2013-02-11 NOTE — Progress Notes (Signed)
Wound re-check at patient's request for concerns of "swelling".  Site appears to be healing well with minimal swelling.  Patients' questions addressed by Dr. Graciela Husbands.  Follow up as scheduled.

## 2013-04-08 ENCOUNTER — Telehealth: Payer: Self-pay | Admitting: Internal Medicine

## 2013-04-08 NOTE — Telephone Encounter (Signed)
New Prob    States both of pts legs are swollen and would like to speak to nurse regarding this. Please call.

## 2013-04-08 NOTE — Telephone Encounter (Addendum)
Unable to reach dr Graciela Husbands, discussed with amber seiler rn, the leads the pt has is not causing the swelling in his legs. Explained that to pt. Follow up made for pt to see dr Graciela Husbands next week. He will go to the ER prior to appt if symptoms change. Will discuss with dr Graciela Husbands on Monday.

## 2013-04-08 NOTE — Telephone Encounter (Signed)
Spoke with pt, for the last three days he has had swelling in his feet and legs. It reports it is present in the morning when he gets up. He denies SOB. He has started watching his salt today. He is very worried about the leads he has in his veins from the previous device he had in his abdomen and wants to make sure that is not what is causing this. Pt made aware dr Graciela Husbands is not here. Encouraged the pt to rest with his legs elevated, cont to watch his salt and will get in contact with dr Graciela Husbands at the hosp and call him back.

## 2013-04-12 MED ORDER — ASPIRIN 81 MG PO TABS: 81.0000 mg | ORAL_TABLET | Freq: Every day | ORAL | Status: AC

## 2013-04-12 NOTE — Telephone Encounter (Signed)
Discussed with dr Graciela Husbands, Spoke with pt, he reports his swelling is coming and going. Today his swelling is good. He does not want a fluid pill at this time. He has an appt this week with dr Graciela Husbands and he will keep that appt. He reports his heart racing and getting SOB when going back to bed after going to the bathroom. The pt will decrease his asa to 81 mg daily per dr Graciela Husbands.

## 2013-04-14 ENCOUNTER — Encounter: Payer: Self-pay | Admitting: Internal Medicine

## 2013-04-14 ENCOUNTER — Ambulatory Visit (INDEPENDENT_AMBULATORY_CARE_PROVIDER_SITE_OTHER): Payer: Self-pay | Admitting: Internal Medicine

## 2013-04-14 VITALS — BP 136/88 | HR 64 | Ht 71.0 in | Wt 190.8 lb

## 2013-04-14 DIAGNOSIS — R300 Dysuria: Secondary | ICD-10-CM | POA: Insufficient documentation

## 2013-04-14 DIAGNOSIS — R609 Edema, unspecified: Secondary | ICD-10-CM | POA: Insufficient documentation

## 2013-04-14 DIAGNOSIS — Z950 Presence of cardiac pacemaker: Secondary | ICD-10-CM

## 2013-04-14 DIAGNOSIS — I495 Sick sinus syndrome: Secondary | ICD-10-CM

## 2013-04-14 LAB — PACEMAKER DEVICE OBSERVATION
ATRIAL PACING PM: 99
BAMS-0001: 180 {beats}/min
BATTERY VOLTAGE: 3.008 V
DEVICE MODEL PM: 7438811
RV LEAD IMPEDENCE PM: 587.5 Ohm
RV LEAD THRESHOLD: 0.625 V
VENTRICULAR PACING PM: 6.9

## 2013-04-14 LAB — URINALYSIS, ROUTINE W REFLEX MICROSCOPIC
Nitrite: NEGATIVE
Urobilinogen, UA: 0.2 (ref 0.0–1.0)

## 2013-04-14 LAB — BASIC METABOLIC PANEL
BUN: 15 mg/dL (ref 6–23)
Calcium: 9.7 mg/dL (ref 8.4–10.5)
Creatinine, Ser: 0.9 mg/dL (ref 0.4–1.5)
GFR: 93.73 mL/min (ref >60.00)

## 2013-04-14 NOTE — Assessment & Plan Note (Signed)
The patient's device was interrogated.  The information was reviewed.  Rate response slope was reprogrammed

## 2013-04-14 NOTE — Assessment & Plan Note (Signed)
We'll check a urinalysis

## 2013-04-14 NOTE — Assessment & Plan Note (Signed)
No evidence at this point of volume overload. No obvious triggers, i.e. Increased salt intake. We'll check a metabolic profile to make sure there is no kidney issues especially given the cooccuring of dysuria

## 2013-04-14 NOTE — Patient Instructions (Addendum)
Your physician recommends that you have lab work today: bmp/urinalysis  Your physician wants you to follow-up in: March 2015 with Dr. Graciela Husbands. You will receive a reminder letter in the mail two months in advance. If you don't receive a letter, please call our office to schedule the follow-up appointment.  Your physician recommends that you continue on your current medications as directed. Please refer to the Current Medication list given to you today.

## 2013-04-14 NOTE — Assessment & Plan Note (Signed)
Stable in 100% atrial pacing. His nocturnal symptoms may be associated with a rapid change of position and excessively sensitive rate response; I've asked her to check his pulse and let us know and we can reprogrammed to smoke

## 2013-04-14 NOTE — Progress Notes (Signed)
Patient Care Team: Sandrea Hughs as PCP - General   HPI  Lawrence Shaw is a 67 y.o. male Seen in followup for sinus node dysfunction.  He is status post pacemaker generator replacement; at the same time he underwent generator replacement 3/14  He called a few weeks ago because of edema; this was associated with dysuria. There is no obvious triggers. His daughter's recommendation he is a see how; he also has decrease his sodium intake and there has been significant improvement  He also has a sensation that when he awakens he goes to the bathroom at night of shortness of breath when he returns. He does not have shortness of breath during the day with activity   He is a complex pacemaker history. He had an abdominal pacemaker with leads. 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 He has a left subclavian venous pacemaker implanted 2002 with generator replacement 2011. This used 1388 leads  He has noted some weakness   Past Medical History  Diagnosis Date  . Hypertension   . Coronary artery disease   . 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 Pacemaker-St. Jude 07/10/2011    Past Surgical History  Procedure Laterality Date  . Pacemaker insertion      Current Outpatient Prescriptions  Medication Sig Dispense Refill  . aspirin 81 MG tablet Take 1 tablet (81 mg total) by mouth daily.      Marland Kitchen atenolol (TENORMIN) 50 MG tablet Take 50 mg by mouth daily.       . diazepam (VALIUM) 5 MG tablet Take 5 mg by mouth every 6 (six) hours as needed. For anxiety      . ibuprofen (ADVIL,MOTRIN) 200 MG tablet Take 200-400 mg by mouth every 6 (six) hours as needed for pain.      Marland Kitchen oxyCODONE-acetaminophen (PERCOCET/ROXICET) 5-325 MG per tablet Take 1 tablet by mouth every 4 (four) hours as needed for pain.      Marland Kitchen quinapril (ACCUPRIL) 5 MG tablet Take 5 mg by mouth daily.       No current facility-administered medications for  this visit.    No Known Allergies  Review of Systems negative except from HPI and PMH  Physical Exam BP 136/88  Pulse 64  Ht 5\' 11"  (1.803 m)  Wt 190 lb 12.8 oz (86.546 kg)  BMI 26.62 kg/m2 Well developed and well nourished in no acute distress HENT normal E scleral and icterus clear Neck Supple JVP flat; carotids brisk and full Clear to ausculation *Regular rate and rhythm, no murmurs gallops or rub Soft with active bowel sounds No clubbing cyanosis none Edema Alert and oriented, grossly normal motor and sensory function Skin Warm and Dry    Assessment and  Plan

## 2013-04-18 ENCOUNTER — Telehealth: Payer: Self-pay | Admitting: *Deleted

## 2013-04-18 NOTE — Telephone Encounter (Signed)
Advised patient of lab results  

## 2013-04-18 NOTE — Telephone Encounter (Signed)
Message copied by Burnell Blanks on Mon Apr 18, 2013  1:26 PM ------      Message from: Duke Salvia      Created: Fri Apr 15, 2013  5:24 PM       Please Inform Patient that labs are normal            Thanks       ------

## 2013-05-04 ENCOUNTER — Encounter: Payer: Self-pay | Admitting: Internal Medicine

## 2013-09-08 ENCOUNTER — Telehealth: Payer: Self-pay | Admitting: Internal Medicine

## 2013-09-08 NOTE — Telephone Encounter (Signed)
New Problem    Pt thinks his BP is to high and would like a call back about what to do.   Pt this has been going in the last week.  Pt does not think med is working.  today BP 160/108 9am  &  149/98  10 am

## 2013-09-08 NOTE — Telephone Encounter (Signed)
Attempted to contact patient, no answer, no machine

## 2013-09-12 ENCOUNTER — Emergency Department (HOSPITAL_BASED_OUTPATIENT_CLINIC_OR_DEPARTMENT_OTHER)
Admission: EM | Admit: 2013-09-12 | Discharge: 2013-09-12 | Disposition: A | Discharge status: Home / Self Care | Payer: Self-pay | Attending: Emergency Medicine | Admitting: Emergency Medicine

## 2013-09-12 ENCOUNTER — Encounter (HOSPITAL_BASED_OUTPATIENT_CLINIC_OR_DEPARTMENT_OTHER): Payer: Self-pay | Admitting: Emergency Medicine

## 2013-09-12 DIAGNOSIS — F17200 Nicotine dependence, unspecified, uncomplicated: Secondary | ICD-10-CM | POA: Insufficient documentation

## 2013-09-12 DIAGNOSIS — R51 Headache: Secondary | ICD-10-CM | POA: Insufficient documentation

## 2013-09-12 DIAGNOSIS — Z7982 Long term (current) use of aspirin: Secondary | ICD-10-CM | POA: Insufficient documentation

## 2013-09-12 DIAGNOSIS — Z950 Presence of cardiac pacemaker: Secondary | ICD-10-CM | POA: Insufficient documentation

## 2013-09-12 DIAGNOSIS — R5381 Other malaise: Secondary | ICD-10-CM | POA: Insufficient documentation

## 2013-09-12 DIAGNOSIS — I2510 Atherosclerotic heart disease of native coronary artery without angina pectoris: Secondary | ICD-10-CM | POA: Insufficient documentation

## 2013-09-12 DIAGNOSIS — I10 Essential (primary) hypertension: Secondary | ICD-10-CM

## 2013-09-12 DIAGNOSIS — Z79899 Other long term (current) drug therapy: Secondary | ICD-10-CM | POA: Insufficient documentation

## 2013-09-12 DIAGNOSIS — R079 Chest pain, unspecified: Secondary | ICD-10-CM | POA: Insufficient documentation

## 2013-09-12 DIAGNOSIS — R5383 Other fatigue: Secondary | ICD-10-CM | POA: Insufficient documentation

## 2013-09-12 NOTE — ED Provider Notes (Signed)
CSN: [id number]     Arrival date & time 09/12/13  1701 History  This chart was scribed for Candyce Churn, MD by Leone Payor, ED Scribe. This patient was seen in room MH06/MH06 and the patient's care was started 5:47 PM.     Chief Complaint  Patient presents with  . Hypertension    Patient is a 67 y.o. male presenting with hypertension. The history is provided by the patient. No language interpreter was used.  Hypertension This is a recurrent problem. The current episode started more than 1 week ago. The problem occurs constantly. The problem has not changed since onset.Associated symptoms include chest pain. Pertinent negatives include no abdominal pain and no shortness of breath. Nothing aggravates the symptoms. Nothing relieves the symptoms. Treatments tried: BP medication  The treatment provided no relief.    HPI Comments: Lawrence Shaw is a 67 y.o. male who presents to the Emergency Department complaining of constant, unchanged HTN that has been ongoing for the past 4-5 weeks. He reports having associated fatigue and HA as well. Pt states his BP has been consistently around 150-160/100 without any changes. Pt states his PCP has increased BP medication but he has not noticed any changes. He checks his BP multiple times per day and states the highest has been around 168/108. He also reports having intermittent, left sided, non-radiating chest pain rated as 2-3/10 with the last episode occuring 2 hours ago. He reports this exact type of pain occurs with no increasing frequency or intensity for several years. Pt states he has a pacemaker and is concerned there may be trouble with it, particularly regarding wires left from previous pacemakers. Pt states he has been seen by his cardiologist but states he was unable to relieve his concerns. He states having increased stress in his life. He denies SOB, dysuria.   Past Medical History  Diagnosis Date  . Hypertension   . Coronary artery disease    . 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 Pacemaker insertion     No family history on file. History  Substance Use Topics  . Smoking status: Current Some Day Smoker    Types: Cigarettes  . Smokeless tobacco: Not on file  . Alcohol Use: No     Comment: Occas.    Review of Systems  Constitutional: Negative for fever.  Respiratory: Negative for cough and shortness of breath.   Cardiovascular: Positive for chest pain.  Gastrointestinal: Negative for nausea, vomiting and abdominal pain.  All other systems reviewed and are negative.    Allergies  Review of patient's allergies indicates no known allergies.  Home Medications   Current Outpatient Rx  Name  Route  Sig  Dispense  Refill  . esomeprazole (NEXIUM) 20 MG packet   Oral   Take 20 mg by mouth daily before breakfast.         . aspirin 81 MG tablet   Oral   Take 1 tablet (81 mg total) by mouth daily.         Marland Kitchen atenolol (TENORMIN) 50 MG tablet   Oral   Take 50 mg by mouth daily.          . diazepam (VALIUM) 5 MG tablet   Oral   Take 5 mg by mouth every 6 (six) hours as needed. For anxiety         .  ibuprofen (ADVIL,MOTRIN) 200 MG tablet   Oral   Take 200-400 mg by mouth every 6 (six) hours as needed for pain.         Marland Kitchen oxyCODONE-acetaminophen (PERCOCET/ROXICET) 5-325 MG per tablet   Oral   Take 1 tablet by mouth every 4 (four) hours as needed for pain.         Marland Kitchen quinapril (ACCUPRIL) 5 MG tablet   Oral   Take 10 mg by mouth daily.           BP 153/95  Pulse 62  Temp(Src) 98.6 F (37 C) (Oral)  Resp 17  Ht 5\' 11"  (1.803 m)  Wt 185 lb (83.915 kg)  BMI 25.81 kg/m2  SpO2 100% Physical Exam  Nursing note and vitals reviewed. Constitutional: He is oriented to person, place, and time. He appears well-developed and well-nourished. No distress.  HENT:   Head: Normocephalic and atraumatic.  Mouth/Throat: Oropharynx is clear and moist.  Eyes: Conjunctivae are normal. Pupils are equal, round, and reactive to light. No scleral icterus.  Neck: Neck supple.  Cardiovascular: Normal rate, regular rhythm, normal heart sounds and intact distal pulses.   No murmur heard. Pulmonary/Chest: Effort normal and breath sounds normal. No stridor. No respiratory distress. He has no wheezes. He has no rales.  Abdominal: Soft. He exhibits no distension. There is no tenderness.  Musculoskeletal: Normal range of motion. He exhibits no edema.  Neurological: He is alert and oriented to person, place, and time.  Skin: Skin is warm and dry. No rash noted.  Psychiatric: He has a normal mood and affect. His behavior is normal.    ED Course  Procedures   DIAGNOSTIC STUDIES: Oxygen Saturation is 100% on RA, normal by my interpretation.    COORDINATION OF CARE: 6:07 PM Discussed treatment plan with pt at bedside and pt agreed to plan.   Labs Review Labs Reviewed - No data to display Imaging Review No results found.  EKG Interpretation    Date/Time:  Monday September 12 2013 18:41:31 EST Ventricular Rate:  61 PR Interval:  238 QRS Duration: 112 QT Interval:  378 QTC Calculation: 380 R Axis:   6 Text Interpretation:  Atrial-paced rhythm with prolonged AV conduction Nonspecific T wave abnormality Abnormal ECG No significant change was found Confirmed by [facility]  MD, TREY ([id number]) on 09/12/2013 7:01:41 PM            MDM   1. Hypertension    Pt is a 67 yo male with hx of HTN presenting with the chief complaint of elevated blood pressures.  He is a very difficult historian, frequently not answering directed questions, but instead replying with another symptom or a story about his prior healthcare.  His blood pressure is moderately elevated, but not acutely worse than his usual.  He does not have signs or symptoms of acute end organ damage.  He reports  some chest pains, completely consistent with prior chronic pain.  He declined blood work to evaluate this chest pain or his hypertension further.  He also reports increasing stress, particularly regarding money.  Advised that he follow up closely with his primary doctor for further management of his blood pressure, chest pain, and stress.  I personally performed the services described in this documentation, which was scribed in my presence. The recorded information has been reviewed and is accurate.     Candyce Churn, MD 09/12/13 [phone]

## 2013-09-12 NOTE — ED Notes (Signed)
Pt cont talking with Langston Masker, PA. Lorin Picket, RN aware.

## 2013-09-12 NOTE — ED Notes (Signed)
Pt calls this RN and T. Alton Revere, charge nurse to room. States he wants to leave, "I came here for help, but that doctor was very rude to me..." pt and wife upset, "we just came for help with my blood pressure being too high, and he got mad at me..." pt encouraged to remain in ed for lab tests and ekg. Pt also states that he does not have insurance and is concerned about costs. Pt encouraged to speak again with dr. Loretha Stapler and decide what tests would be best done to ensure that he is safe to go home and perhaps follow up with his pcp tomorrow. Pt states that he will leave if he has to see md again. Pt informed that Langston Masker, [place] could see him, examine him and order lab tests. Pt and wife agree to stay in ed if Langston Masker will see them. Langston Masker notified, agrees to see pt.

## 2013-09-12 NOTE — ED Notes (Signed)
Pt. Reports his blood pressure has been reading high at home for 3 wks or more  Pt. Reports he saw his PMD 2 wks ago and called his Dr. But per Pt. " nothing done".

## 2013-09-13 NOTE — Telephone Encounter (Signed)
Called patient back again. Patient explains his pressure is still high and he doesn't feel good. Patient went to [facility] yesterday - no new meds started/given. He voiced displeasure with MD that took care of him. I advised patient to see his PCP today to discuss issue. Patient agreeable to plan and will call if nothing gets resolved.

## 2013-10-26 IMAGING — CR DG CHEST 2V
2 series · 2 of 2 positions shown · non-contrast
Comparison: 01/17/2013

CLINICAL DATA: Post pacemaker placement.  No current complaints.
Hypertension.

CHEST - 2 VIEW

[w chest pa]
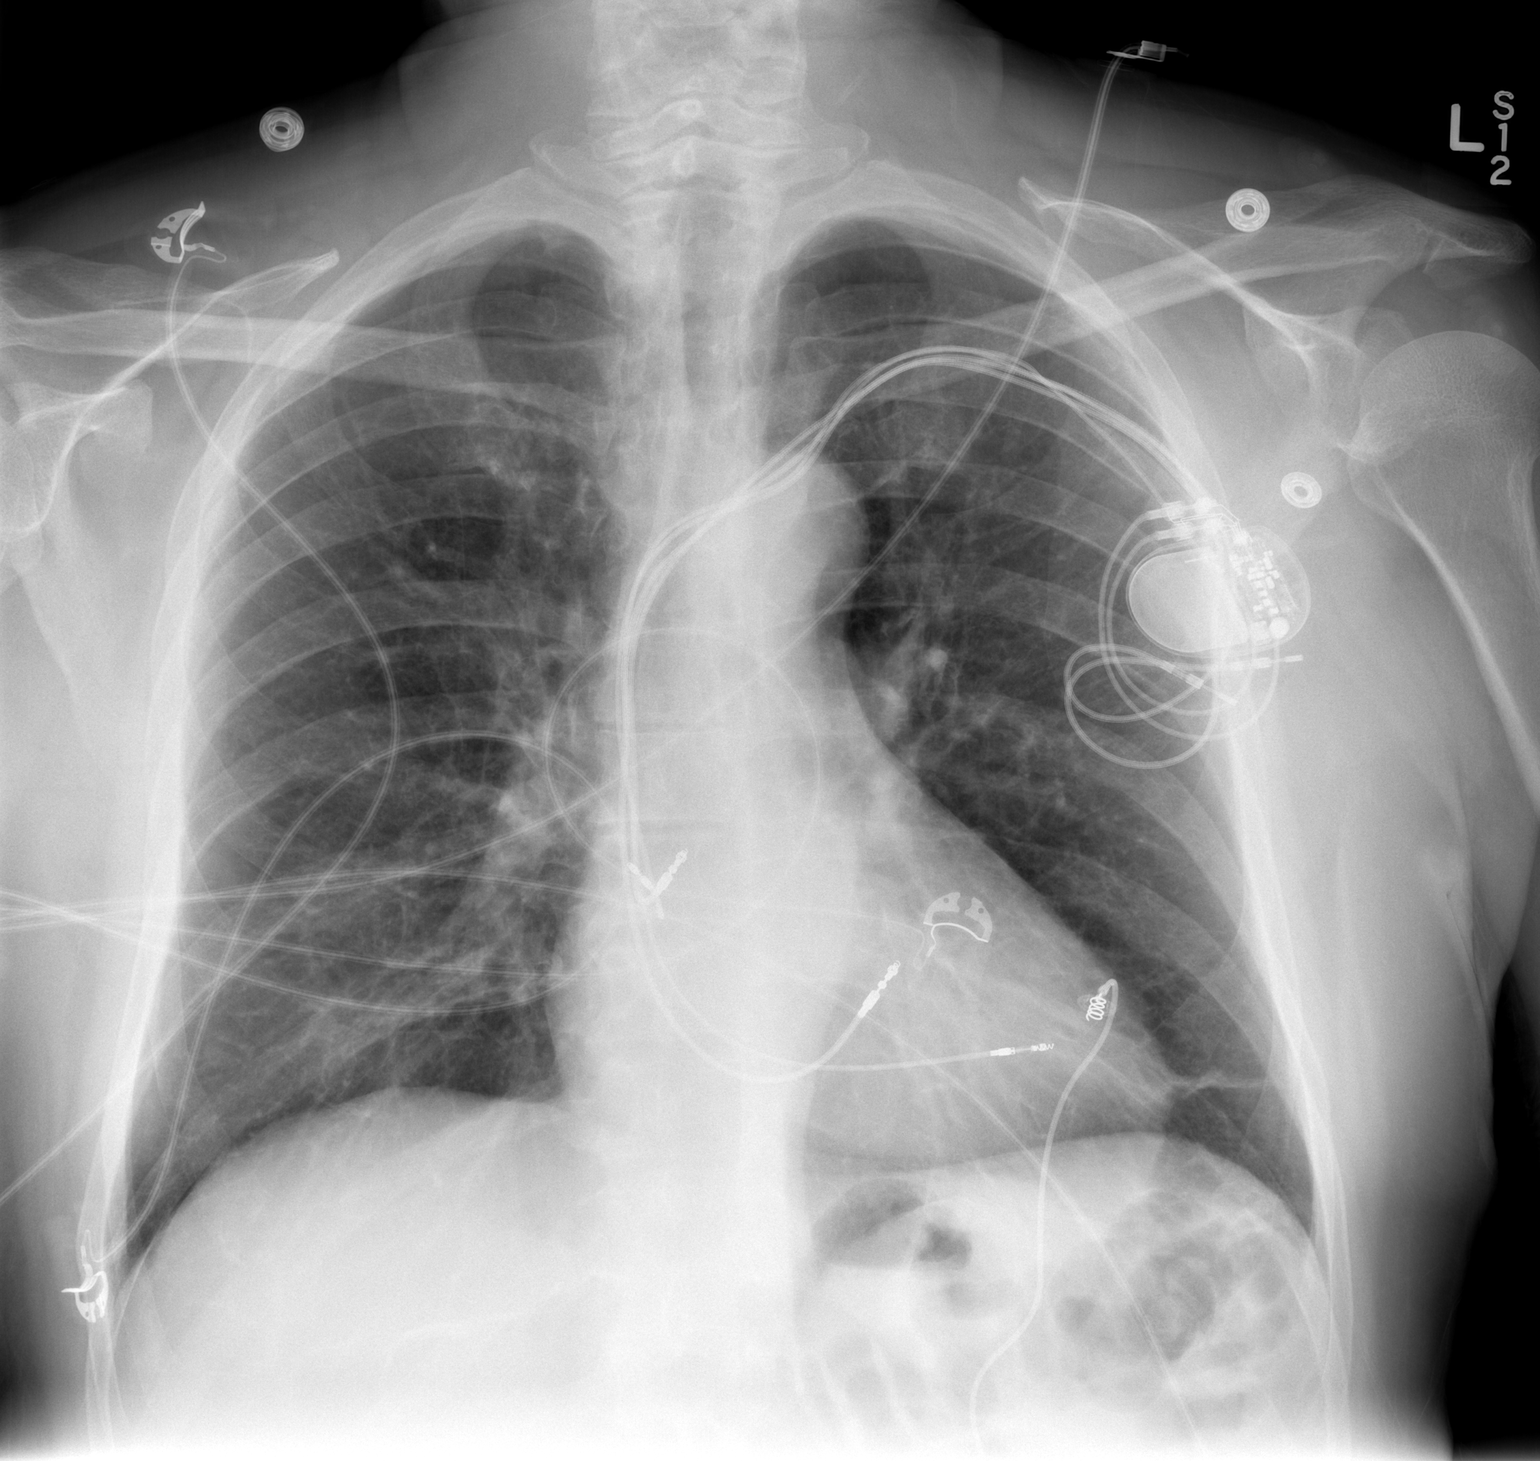

[w chest lat]
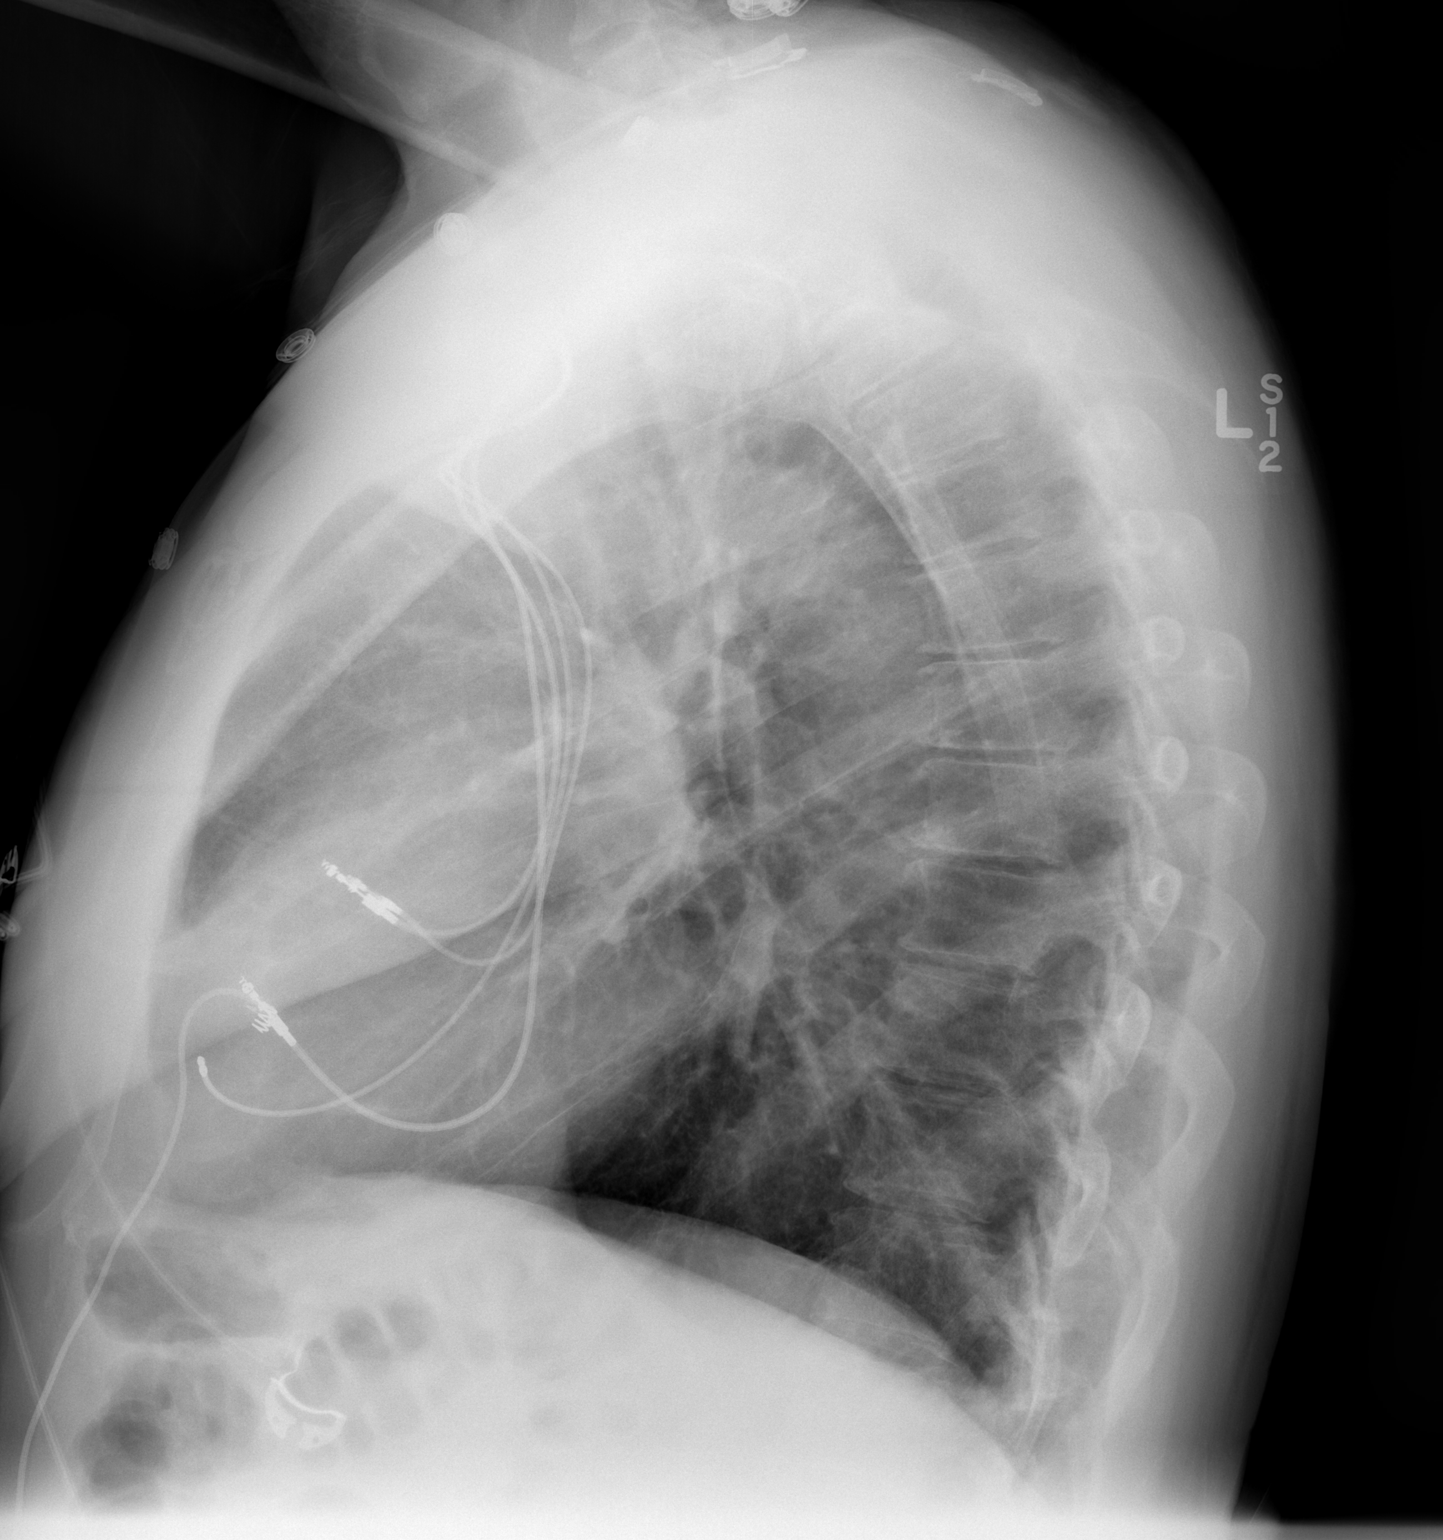

[2 of 2 positions shown; findings below may reference images not displayed]

FINDINGS: Placement of a dual lead pacer with leads right atrium
right ventricle.  The old pacer wires remain in place as well.

Midline trachea.  Patient minimally rotated left. Borderline
cardiomegaly.     Mediastinal contours otherwise within normal
limits.  No pleural effusion or pneumothorax.  No congestive
failure.

Mild atelectasis at the left lung base.
IMPRESSION: Expected appearance after a dual lead pacer placement.  No
pneumothorax or other acute complication.

## 2014-01-27 ENCOUNTER — Encounter: Payer: Self-pay | Admitting: *Deleted

## 2014-09-28 ENCOUNTER — Encounter (HOSPITAL_COMMUNITY): Payer: Self-pay | Admitting: Internal Medicine

## 2019-10-12 ENCOUNTER — Other Ambulatory Visit: Payer: Self-pay

## 2019-10-12 ENCOUNTER — Encounter (HOSPITAL_COMMUNITY): Payer: Self-pay | Admitting: Emergency Medicine

## 2019-10-12 ENCOUNTER — Emergency Department (HOSPITAL_COMMUNITY)
Admission: EM | Admit: 2019-10-12 | Discharge: 2019-10-21 | Disposition: E | Payer: Self-pay | Attending: Emergency Medicine | Admitting: Emergency Medicine

## 2019-10-12 DIAGNOSIS — I10 Essential (primary) hypertension: Secondary | ICD-10-CM | POA: Insufficient documentation

## 2019-10-12 DIAGNOSIS — Z720 Tobacco use: Secondary | ICD-10-CM | POA: Insufficient documentation

## 2019-10-12 DIAGNOSIS — Z950 Presence of cardiac pacemaker: Secondary | ICD-10-CM | POA: Insufficient documentation

## 2019-10-12 DIAGNOSIS — I2510 Atherosclerotic heart disease of native coronary artery without angina pectoris: Secondary | ICD-10-CM | POA: Insufficient documentation

## 2019-10-12 DIAGNOSIS — I469 Cardiac arrest, cause unspecified: Secondary | ICD-10-CM | POA: Insufficient documentation

## 2019-10-12 MED ORDER — EPINEPHRINE 1 MG/10ML IJ SOSY
PREFILLED_SYRINGE | INTRAMUSCULAR | Status: AC | PRN
  Administered 2019-10-12: 1 mg via INTRAVENOUS

## 2019-10-12 MED ORDER — SODIUM BICARBONATE 8.4 % IV SOLN
INTRAVENOUS | Status: AC | PRN
  Administered 2019-10-12: 50 meq via INTRAVENOUS

## 2019-10-13 MED FILL — Medication: Qty: 1 | Status: AC

## 2019-10-21 NOTE — Code Documentation (Signed)
Chest compression pause, Heart [place] made by DR. Mesner slow PEA.

## 2019-10-21 NOTE — ED Provider Notes (Addendum)
Emergency Department Provider Note   I have reviewed the triage vital signs and the nursing notes.   HISTORY  Chief Complaint Cardiac Arrest   HPI Lawrence Shaw is a 74 y.o. male who presents with EMS secondary to cardiac arrest. Soundly the patient started having pelvic pain pretty severe this morning woke his wife up. He subsequently started looked very pale and sweaty and then lost consciousness. On EMS arrival patient initially had a pulse that was normal sinus rhythm but was unresponsive. Patient quickly became asystolic and CPR was initiated. After approximately 5 rounds of epinephrine, sodium bicarb the patient achieved ROSC. He had already been intubated and brought here for further evaluation losing pulses again in route. His blood sugar was 135. Approximately 1 hour of downtime prior to arrival.   LEVEL V CAVEAT APPLIES SECONDARY TO cardiac arrest   Past Medical History:  Diagnosis Date  . Coronary artery disease   . Hypertension   . 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 Sinoatrial node dysfunction ([place]) 08/10/2009   Qualifier: Diagnosis of  By: Hollie Salk CMA, Amanda      Patient Active Problem List   Diagnosis Date Noted  . Dysuria 04/14/2013  . Edema 04/14/2013  . Mechanical complication due to cardiac pacemaker (electrode) 01/14/2013  . Palpitations 07/10/2011  . 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 MYALGIA 08/07/2010  . ORTHOSTATIC DIZZINESS 06/04/2010  . DYSPNEA ON EXERTION 06/04/2010  . ANXIETY DEPRESSION 02/12/2010  . CHEST PAIN, PRECORDIAL 08/14/2009  . SINOATRIAL NODE DYSFUNCTION 08/10/2009    Past Surgical History:  Procedure Laterality Date  . LEAD REVISION N/A 01/17/2013   Procedure: LEAD REVISION;  Surgeon: Deboraha Sprang, MD;  Location[facility] CATH LAB;  Service: Cardiovascular;  Laterality: N/A;  . PACEMAKER INSERTION    . VENOGRAM N/A 01/17/2013   Procedure: VENOGRAM;  Surgeon: Deboraha Sprang, MD;  Location: [facility] CATH LAB;  Service: Cardiovascular;   Laterality: N/A;    Current Outpatient Rx  . Order #: [id number] Class: No Print  . Order #: [id number] Class: Historical Med  . Order #: [id number] Class: Historical Med  . Order #: [id number] Class: Historical Med  . Order #: [id number] Class: Historical Med  . Order #: [id number] Class: Historical Med  . Order #: [id number] Class: Historical Med    Allergies Patient has no known allergies.  History reviewed. No pertinent family history.  Social History Social History   Tobacco Use  . Smoking status: Current Some Day Smoker    Types: Cigarettes  . Smokeless tobacco: Never Used  Substance Use Topics  . Alcohol use: No    Comment: Occas.  . Drug use: No    Review of Systems  LEVEL V CAVEAT APPLIES SECONDARY TO cardiac arrest ____________________________________________  PHYSICAL EXAM:  VITAL SIGNS: ED Triage Vitals  Enc Vitals Group     BP 11-02-19 0526 (!) 104/11     Pulse Rate 11-02-2019 0526 (!) 0     Resp 11-02-2019 0526 (!) 0     Temp 11/02/19 0526 (!) 83.3 F (28.5 C)     Temp src --      SpO2 11-02-19 0526 (!) 0 %     Weight Nov 02, 2019 0538 180 lb (81.6 kg)     Height 2019/11/02 0538 5\' 9"  (1.753 m)    Constitutional: Well appearing. Well nourished. Eyes: Conjunctivae are normal. Dilated and nonreactive Pupils.  Head: Atraumatic. Nose: No congestion/rhinnorhea. Mouth/Throat: Mucous membranes are moist.  Oropharynx non-erythematous. Neck: No stridor.  No meningeal signs.   Cardiovascular: no rate, no  rhythm.  Respiratory: no respiratory effort.  Lungs clear with ETT bagging Gastrointestinal: Soft and nontender. No distention.  Musculoskeletal: No lower extremity tenderness nor edema. No gross deformities of extremities. Neurologic:  Not able to assess 2/2 condition.  Skin:   No rash noted. Psych: Not able to assess 2/2 condition.   ____________________________________________   PROCEDURES  Procedure(s) performed:   Cardiopulmonary Resuscitation (CPR) Procedure  Note Directed/Performed by: Marily Memos I personally directed ancillary staff and/or performed CPR in an effort to regain return of spontaneous circulation and to maintain cardiac, neuro and systemic perfusion.    .Critical Care Performed by: Marily Memos, MD Authorized by: Marily Memos, MD   Critical care provider statement:    Critical care time (minutes):  45   Critical care was necessary to treat or prevent imminent or life-threatening deterioration of the following conditions:  Cardiac failure, circulatory failure, respiratory failure and CNS failure or compromise   Critical care was time spent personally by me on the following activities:  Discussions with consultants, evaluation of patient's response to treatment, examination of patient, ordering and performing treatments and interventions, ordering and review of laboratory studies, ordering and review of radiographic studies, pulse oximetry, re-evaluation of patient's condition, obtaining history from patient or surrogate and review of old charts   ____________________________________________   INITIAL IMPRESSION / ASSESSMENT AND PLAN / ED COURSE  Pertinent labs & imaging results that were available during my care of the patient were reviewed by me and considered in my medical decision making (see chart for details).  Late entry is for code sheet secondary to delayed registration however patient was worked on in the emergency department for approximately half an hour with multiple doses of epinephrine and bicarb given without return spontaneous circulation. Patient's heart was standstill on bedside ultrasound. Secondary to groin pain no lower abdominal bedside ultrasound was performed that did not show any evidence of aortic rupture or free fluid. Pupils were consistently fixed and dilated. Secondary to prolonged downtime and no ROSC after significant interventions here and low to no likelihood for neurologic outcome even if ROSC  was miraculously achieved the code was called at 0532. Family here shortly after and made aware. Discussed with ME and likely presumed cause was CAD 2/2 history.   ____________________________________________  FINAL CLINICAL IMPRESSION(S) / ED DIAGNOSES  Final diagnoses:  Cardiac arrest [facility])    MEDICATIONS GIVEN DURING THIS VISIT:  Medications  EPINEPHrine (ADRENALIN) 1 MG/10ML injection (1 mg Intravenous Given 10-19-19 0524)  sodium bicarbonate injection (50 mEq Intravenous Given 10-19-2019 0525)  EPINEPHrine (ADRENALIN) 1 MG/10ML injection (1 mg Intravenous Given 10/19/2019 0529)  sodium bicarbonate injection (50 mEq Intravenous Given 19-Oct-2019 0529)    NEW OUTPATIENT MEDICATIONS STARTED DURING THIS VISIT:  New Prescriptions   No medications on file    Note:  This document was prepared using Dragon voice recognition software and may include unintentional dictation errors.    Annamae Shivley, Barbara Cower, MD 2019/10/19 [id number]    Marily Memos, MD 10/26/19 [phone]

## 2019-10-21 NOTE — Progress Notes (Signed)
Chaplain was able to provide support to family at Admiral's bedside.

## 2019-10-21 NOTE — Code Documentation (Signed)
Patient time of death occurred at 0532 by Dr. Dayna Barker.

## 2019-10-21 NOTE — ED Triage Notes (Signed)
Pt brought to ED by [facility] from home after pt became unresponsive on his house at 3:30 am CPR started by EMS at 4:03 6 epi and one bicarb given by EMS whit a ROSC  At 4:30, pt lost pulses on EMS arrival again at 5:13, CPR started again by EMS prior to enter to the ED.

## 2019-10-21 NOTE — Code Documentation (Signed)
Chest compression pause, pulse check no pulse, pt continue on PEA., CO2 18

## 2019-10-21 DEATH — deceased
